# Patient Record
Sex: Male | Born: 1945
Health system: Southern US, Community
[De-identification: ages and names within clinical notes are randomized; demographics above are authoritative.]

## PROBLEM LIST (undated history)

## (undated) DIAGNOSIS — I4891 Unspecified atrial fibrillation: Secondary | ICD-10-CM

## (undated) DIAGNOSIS — E119 Type 2 diabetes mellitus without complications: Secondary | ICD-10-CM

## (undated) DIAGNOSIS — R972 Elevated prostate specific antigen [PSA]: Secondary | ICD-10-CM

## (undated) DIAGNOSIS — I499 Cardiac arrhythmia, unspecified: Secondary | ICD-10-CM

## (undated) DIAGNOSIS — Z860100 Personal history of colon polyps, unspecified: Secondary | ICD-10-CM

## (undated) DIAGNOSIS — G459 Transient cerebral ischemic attack, unspecified: Secondary | ICD-10-CM

## (undated) DIAGNOSIS — I219 Acute myocardial infarction, unspecified: Secondary | ICD-10-CM

## (undated) DIAGNOSIS — N289 Disorder of kidney and ureter, unspecified: Secondary | ICD-10-CM

## (undated) DIAGNOSIS — K5790 Diverticulosis of intestine, part unspecified, without perforation or abscess without bleeding: Secondary | ICD-10-CM

## (undated) DIAGNOSIS — J45909 Unspecified asthma, uncomplicated: Secondary | ICD-10-CM

## (undated) DIAGNOSIS — E782 Mixed hyperlipidemia: Secondary | ICD-10-CM

## (undated) DIAGNOSIS — I10 Essential (primary) hypertension: Secondary | ICD-10-CM

## (undated) DIAGNOSIS — J449 Chronic obstructive pulmonary disease, unspecified: Secondary | ICD-10-CM

## (undated) HISTORY — DX: Unspecified asthma, uncomplicated: J45.909

## (undated) HISTORY — DX: Acute myocardial infarction, unspecified: I21.9

## (undated) HISTORY — DX: Mixed hyperlipidemia: E78.2

## (undated) HISTORY — DX: Unspecified atrial fibrillation: I48.91

## (undated) HISTORY — DX: Cardiac arrhythmia, unspecified: I49.9

## (undated) HISTORY — DX: Transient cerebral ischemic attack, unspecified: G45.9

## (undated) HISTORY — PX: LUNG LOBECTOMY: SHX167

## (undated) HISTORY — DX: Disorder of kidney and ureter, unspecified: N28.9

## (undated) HISTORY — DX: Type 2 diabetes mellitus without complications: E11.9

## (undated) HISTORY — DX: Elevated prostate specific antigen (PSA): R97.20

## (undated) HISTORY — DX: Diverticulosis of intestine, part unspecified, without perforation or abscess without bleeding: K57.90

## (undated) HISTORY — PX: TONSILLECTOMY AND ADENOIDECTOMY: SHX28

## (undated) HISTORY — DX: Essential (primary) hypertension: I10

## (undated) HISTORY — PX: OTHER SURGICAL HISTORY: SHX169

## (undated) HISTORY — DX: Chronic obstructive pulmonary disease, unspecified: J44.9

## (undated) HISTORY — DX: Personal history of colon polyps, unspecified: Z86.0100

---

## 2000-10-11 ENCOUNTER — Inpatient Hospital Stay (HOSPITAL_COMMUNITY): Admission: AD | Admit: 2000-10-11 | Discharge: 2000-10-18 | Payer: Self-pay | Admitting: Thoracic Surgery

## 2000-10-11 ENCOUNTER — Encounter: Payer: Self-pay | Admitting: Thoracic Surgery

## 2000-10-12 ENCOUNTER — Encounter: Payer: Self-pay | Admitting: Thoracic Surgery

## 2000-10-13 ENCOUNTER — Encounter: Payer: Self-pay | Admitting: Thoracic Surgery

## 2000-10-14 ENCOUNTER — Encounter: Payer: Self-pay | Admitting: Thoracic Surgery

## 2000-10-15 ENCOUNTER — Encounter: Payer: Self-pay | Admitting: Thoracic Surgery

## 2000-10-15 ENCOUNTER — Encounter: Payer: Self-pay | Admitting: Surgery

## 2000-10-16 ENCOUNTER — Encounter: Payer: Self-pay | Admitting: Thoracic Surgery

## 2000-10-17 ENCOUNTER — Encounter: Payer: Self-pay | Admitting: Thoracic Surgery

## 2000-10-18 ENCOUNTER — Encounter: Payer: Self-pay | Admitting: Thoracic Surgery

## 2000-10-25 ENCOUNTER — Encounter: Admission: RE | Admit: 2000-10-25 | Discharge: 2000-10-25 | Payer: Self-pay | Admitting: Thoracic Surgery

## 2000-10-25 ENCOUNTER — Encounter: Payer: Self-pay | Admitting: Thoracic Surgery

## 2000-11-15 ENCOUNTER — Encounter: Admission: RE | Admit: 2000-11-15 | Discharge: 2000-11-15 | Payer: Self-pay | Admitting: Thoracic Surgery

## 2000-11-15 ENCOUNTER — Encounter: Payer: Self-pay | Admitting: Thoracic Surgery

## 2014-10-29 DIAGNOSIS — I480 Paroxysmal atrial fibrillation: Secondary | ICD-10-CM | POA: Insufficient documentation

## 2014-10-29 DIAGNOSIS — I10 Essential (primary) hypertension: Secondary | ICD-10-CM | POA: Insufficient documentation

## 2015-10-01 DIAGNOSIS — E785 Hyperlipidemia, unspecified: Secondary | ICD-10-CM | POA: Insufficient documentation

## 2015-12-15 DIAGNOSIS — I10 Essential (primary) hypertension: Secondary | ICD-10-CM | POA: Diagnosis not present

## 2015-12-15 DIAGNOSIS — Z6829 Body mass index (BMI) 29.0-29.9, adult: Secondary | ICD-10-CM | POA: Diagnosis not present

## 2015-12-15 DIAGNOSIS — N529 Male erectile dysfunction, unspecified: Secondary | ICD-10-CM | POA: Diagnosis not present

## 2015-12-15 DIAGNOSIS — J449 Chronic obstructive pulmonary disease, unspecified: Secondary | ICD-10-CM | POA: Diagnosis not present

## 2015-12-15 DIAGNOSIS — E663 Overweight: Secondary | ICD-10-CM | POA: Diagnosis not present

## 2016-01-18 DIAGNOSIS — N529 Male erectile dysfunction, unspecified: Secondary | ICD-10-CM | POA: Diagnosis not present

## 2016-01-18 DIAGNOSIS — R972 Elevated prostate specific antigen [PSA]: Secondary | ICD-10-CM | POA: Diagnosis not present

## 2016-01-18 DIAGNOSIS — N401 Enlarged prostate with lower urinary tract symptoms: Secondary | ICD-10-CM | POA: Diagnosis not present

## 2016-03-02 DIAGNOSIS — N529 Male erectile dysfunction, unspecified: Secondary | ICD-10-CM | POA: Diagnosis not present

## 2016-03-02 DIAGNOSIS — Z961 Presence of intraocular lens: Secondary | ICD-10-CM | POA: Diagnosis not present

## 2016-03-02 DIAGNOSIS — N401 Enlarged prostate with lower urinary tract symptoms: Secondary | ICD-10-CM | POA: Diagnosis not present

## 2016-03-02 DIAGNOSIS — R972 Elevated prostate specific antigen [PSA]: Secondary | ICD-10-CM | POA: Diagnosis not present

## 2016-03-02 DIAGNOSIS — H40013 Open angle with borderline findings, low risk, bilateral: Secondary | ICD-10-CM | POA: Diagnosis not present

## 2016-03-15 DIAGNOSIS — R972 Elevated prostate specific antigen [PSA]: Secondary | ICD-10-CM | POA: Diagnosis not present

## 2016-03-15 DIAGNOSIS — R739 Hyperglycemia, unspecified: Secondary | ICD-10-CM | POA: Diagnosis not present

## 2016-03-24 DIAGNOSIS — I480 Paroxysmal atrial fibrillation: Secondary | ICD-10-CM | POA: Diagnosis not present

## 2016-03-24 DIAGNOSIS — I10 Essential (primary) hypertension: Secondary | ICD-10-CM | POA: Diagnosis not present

## 2016-03-24 DIAGNOSIS — E785 Hyperlipidemia, unspecified: Secondary | ICD-10-CM | POA: Diagnosis not present

## 2016-03-24 DIAGNOSIS — Z6830 Body mass index (BMI) 30.0-30.9, adult: Secondary | ICD-10-CM | POA: Diagnosis not present

## 2016-03-28 DIAGNOSIS — E1165 Type 2 diabetes mellitus with hyperglycemia: Secondary | ICD-10-CM | POA: Diagnosis not present

## 2016-06-03 DIAGNOSIS — R972 Elevated prostate specific antigen [PSA]: Secondary | ICD-10-CM | POA: Diagnosis not present

## 2016-06-03 DIAGNOSIS — N401 Enlarged prostate with lower urinary tract symptoms: Secondary | ICD-10-CM | POA: Diagnosis not present

## 2016-06-03 DIAGNOSIS — N529 Male erectile dysfunction, unspecified: Secondary | ICD-10-CM | POA: Diagnosis not present

## 2016-06-06 DIAGNOSIS — J101 Influenza due to other identified influenza virus with other respiratory manifestations: Secondary | ICD-10-CM | POA: Diagnosis not present

## 2016-06-06 DIAGNOSIS — Z6829 Body mass index (BMI) 29.0-29.9, adult: Secondary | ICD-10-CM | POA: Diagnosis not present

## 2016-06-06 DIAGNOSIS — E663 Overweight: Secondary | ICD-10-CM | POA: Diagnosis not present

## 2016-07-01 DIAGNOSIS — E663 Overweight: Secondary | ICD-10-CM | POA: Diagnosis not present

## 2016-07-01 DIAGNOSIS — E782 Mixed hyperlipidemia: Secondary | ICD-10-CM | POA: Diagnosis not present

## 2016-07-01 DIAGNOSIS — E785 Hyperlipidemia, unspecified: Secondary | ICD-10-CM | POA: Diagnosis not present

## 2016-07-01 DIAGNOSIS — Z6829 Body mass index (BMI) 29.0-29.9, adult: Secondary | ICD-10-CM | POA: Diagnosis not present

## 2016-07-01 DIAGNOSIS — E119 Type 2 diabetes mellitus without complications: Secondary | ICD-10-CM | POA: Diagnosis not present

## 2016-07-01 DIAGNOSIS — I10 Essential (primary) hypertension: Secondary | ICD-10-CM | POA: Diagnosis not present

## 2016-07-14 DIAGNOSIS — H4302 Vitreous prolapse, left eye: Secondary | ICD-10-CM | POA: Diagnosis not present

## 2016-07-29 DIAGNOSIS — H43812 Vitreous degeneration, left eye: Secondary | ICD-10-CM | POA: Diagnosis not present

## 2016-07-29 DIAGNOSIS — H35033 Hypertensive retinopathy, bilateral: Secondary | ICD-10-CM | POA: Diagnosis not present

## 2016-07-29 DIAGNOSIS — H4312 Vitreous hemorrhage, left eye: Secondary | ICD-10-CM | POA: Diagnosis not present

## 2016-07-29 DIAGNOSIS — H35422 Microcystoid degeneration of retina, left eye: Secondary | ICD-10-CM | POA: Diagnosis not present

## 2016-08-29 DIAGNOSIS — H35422 Microcystoid degeneration of retina, left eye: Secondary | ICD-10-CM | POA: Diagnosis not present

## 2016-08-29 DIAGNOSIS — H35033 Hypertensive retinopathy, bilateral: Secondary | ICD-10-CM | POA: Diagnosis not present

## 2016-08-29 DIAGNOSIS — E113291 Type 2 diabetes mellitus with mild nonproliferative diabetic retinopathy without macular edema, right eye: Secondary | ICD-10-CM | POA: Diagnosis not present

## 2016-08-29 DIAGNOSIS — H4312 Vitreous hemorrhage, left eye: Secondary | ICD-10-CM | POA: Diagnosis not present

## 2016-08-31 DIAGNOSIS — N529 Male erectile dysfunction, unspecified: Secondary | ICD-10-CM | POA: Diagnosis not present

## 2016-08-31 DIAGNOSIS — N401 Enlarged prostate with lower urinary tract symptoms: Secondary | ICD-10-CM | POA: Diagnosis not present

## 2016-08-31 DIAGNOSIS — R339 Retention of urine, unspecified: Secondary | ICD-10-CM | POA: Diagnosis not present

## 2016-08-31 DIAGNOSIS — H40013 Open angle with borderline findings, low risk, bilateral: Secondary | ICD-10-CM | POA: Diagnosis not present

## 2016-08-31 DIAGNOSIS — H4312 Vitreous hemorrhage, left eye: Secondary | ICD-10-CM | POA: Diagnosis not present

## 2016-08-31 DIAGNOSIS — R972 Elevated prostate specific antigen [PSA]: Secondary | ICD-10-CM | POA: Diagnosis not present

## 2016-08-31 DIAGNOSIS — Z961 Presence of intraocular lens: Secondary | ICD-10-CM | POA: Diagnosis not present

## 2016-09-07 ENCOUNTER — Other Ambulatory Visit: Payer: Self-pay | Admitting: Pharmacy Technician

## 2016-09-07 NOTE — Patient Outreach (Signed)
[facility]) Care Management  09/07/2016  Winfred [place] 1946/03/07 [id number]  Contacted patient in reference to medication adherence for [employer]. Patient insist that he takes Atorvastatin as prescribed and does not miss any doses or have any barriers. I made sure the patient was aware that he could get 3 month supplies going forward if he was interested.  Daryll Brod, CPhT [facility] [employer] [phone]

## 2016-09-09 DIAGNOSIS — I10 Essential (primary) hypertension: Secondary | ICD-10-CM | POA: Diagnosis not present

## 2016-09-09 DIAGNOSIS — E785 Hyperlipidemia, unspecified: Secondary | ICD-10-CM | POA: Diagnosis not present

## 2016-09-09 DIAGNOSIS — I480 Paroxysmal atrial fibrillation: Secondary | ICD-10-CM | POA: Diagnosis not present

## 2016-09-09 DIAGNOSIS — Z6830 Body mass index (BMI) 30.0-30.9, adult: Secondary | ICD-10-CM | POA: Diagnosis not present

## 2016-09-24 DIAGNOSIS — Z8673 Personal history of transient ischemic attack (TIA), and cerebral infarction without residual deficits: Secondary | ICD-10-CM | POA: Diagnosis not present

## 2016-09-24 DIAGNOSIS — R42 Dizziness and giddiness: Secondary | ICD-10-CM | POA: Diagnosis not present

## 2016-09-24 DIAGNOSIS — G459 Transient cerebral ischemic attack, unspecified: Secondary | ICD-10-CM | POA: Diagnosis not present

## 2016-09-24 DIAGNOSIS — Z5321 Procedure and treatment not carried out due to patient leaving prior to being seen by health care provider: Secondary | ICD-10-CM | POA: Diagnosis not present

## 2016-09-24 DIAGNOSIS — I4891 Unspecified atrial fibrillation: Secondary | ICD-10-CM | POA: Diagnosis not present

## 2016-09-24 DIAGNOSIS — R000 Tachycardia, unspecified: Secondary | ICD-10-CM | POA: Diagnosis not present

## 2016-09-24 DIAGNOSIS — E119 Type 2 diabetes mellitus without complications: Secondary | ICD-10-CM | POA: Diagnosis not present

## 2016-09-24 DIAGNOSIS — R55 Syncope and collapse: Secondary | ICD-10-CM | POA: Diagnosis not present

## 2016-09-28 DIAGNOSIS — E663 Overweight: Secondary | ICD-10-CM | POA: Diagnosis not present

## 2016-09-28 DIAGNOSIS — E785 Hyperlipidemia, unspecified: Secondary | ICD-10-CM | POA: Diagnosis not present

## 2016-09-28 DIAGNOSIS — E119 Type 2 diabetes mellitus without complications: Secondary | ICD-10-CM | POA: Diagnosis not present

## 2016-09-28 DIAGNOSIS — Z6829 Body mass index (BMI) 29.0-29.9, adult: Secondary | ICD-10-CM | POA: Diagnosis not present

## 2016-09-28 DIAGNOSIS — Z9181 History of falling: Secondary | ICD-10-CM | POA: Diagnosis not present

## 2016-10-01 DIAGNOSIS — Z87891 Personal history of nicotine dependence: Secondary | ICD-10-CM | POA: Diagnosis not present

## 2016-10-01 DIAGNOSIS — Z8673 Personal history of transient ischemic attack (TIA), and cerebral infarction without residual deficits: Secondary | ICD-10-CM | POA: Diagnosis not present

## 2016-10-01 DIAGNOSIS — I4892 Unspecified atrial flutter: Secondary | ICD-10-CM | POA: Diagnosis not present

## 2016-10-01 DIAGNOSIS — I10 Essential (primary) hypertension: Secondary | ICD-10-CM | POA: Diagnosis not present

## 2016-10-01 DIAGNOSIS — Z79899 Other long term (current) drug therapy: Secondary | ICD-10-CM | POA: Diagnosis not present

## 2016-10-01 DIAGNOSIS — I4891 Unspecified atrial fibrillation: Secondary | ICD-10-CM | POA: Diagnosis not present

## 2016-10-01 DIAGNOSIS — E7800 Pure hypercholesterolemia, unspecified: Secondary | ICD-10-CM | POA: Diagnosis not present

## 2016-10-01 DIAGNOSIS — R55 Syncope and collapse: Secondary | ICD-10-CM | POA: Diagnosis not present

## 2016-10-01 DIAGNOSIS — E119 Type 2 diabetes mellitus without complications: Secondary | ICD-10-CM | POA: Diagnosis not present

## 2016-10-01 DIAGNOSIS — Z7984 Long term (current) use of oral hypoglycemic drugs: Secondary | ICD-10-CM | POA: Diagnosis not present

## 2016-10-12 DIAGNOSIS — R972 Elevated prostate specific antigen [PSA]: Secondary | ICD-10-CM | POA: Diagnosis not present

## 2016-10-12 DIAGNOSIS — N401 Enlarged prostate with lower urinary tract symptoms: Secondary | ICD-10-CM | POA: Diagnosis not present

## 2016-10-12 DIAGNOSIS — R944 Abnormal results of kidney function studies: Secondary | ICD-10-CM | POA: Diagnosis not present

## 2016-10-12 DIAGNOSIS — Z79899 Other long term (current) drug therapy: Secondary | ICD-10-CM | POA: Diagnosis not present

## 2016-10-12 DIAGNOSIS — R55 Syncope and collapse: Secondary | ICD-10-CM | POA: Diagnosis not present

## 2016-10-12 DIAGNOSIS — D729 Disorder of white blood cells, unspecified: Secondary | ICD-10-CM | POA: Diagnosis not present

## 2016-10-31 DIAGNOSIS — R739 Hyperglycemia, unspecified: Secondary | ICD-10-CM | POA: Diagnosis not present

## 2016-10-31 DIAGNOSIS — D649 Anemia, unspecified: Secondary | ICD-10-CM | POA: Diagnosis not present

## 2016-11-02 DIAGNOSIS — D519 Vitamin B12 deficiency anemia, unspecified: Secondary | ICD-10-CM | POA: Diagnosis not present

## 2016-11-02 DIAGNOSIS — R5383 Other fatigue: Secondary | ICD-10-CM | POA: Diagnosis not present

## 2016-11-02 DIAGNOSIS — D649 Anemia, unspecified: Secondary | ICD-10-CM | POA: Diagnosis not present

## 2016-12-21 DIAGNOSIS — R0609 Other forms of dyspnea: Secondary | ICD-10-CM | POA: Diagnosis not present

## 2016-12-21 DIAGNOSIS — Z7901 Long term (current) use of anticoagulants: Secondary | ICD-10-CM | POA: Insufficient documentation

## 2016-12-21 DIAGNOSIS — I480 Paroxysmal atrial fibrillation: Secondary | ICD-10-CM | POA: Diagnosis not present

## 2016-12-21 DIAGNOSIS — I10 Essential (primary) hypertension: Secondary | ICD-10-CM | POA: Diagnosis not present

## 2016-12-29 DIAGNOSIS — Z79899 Other long term (current) drug therapy: Secondary | ICD-10-CM | POA: Diagnosis not present

## 2016-12-29 DIAGNOSIS — I498 Other specified cardiac arrhythmias: Secondary | ICD-10-CM | POA: Diagnosis not present

## 2016-12-29 DIAGNOSIS — I10 Essential (primary) hypertension: Secondary | ICD-10-CM | POA: Diagnosis not present

## 2016-12-29 DIAGNOSIS — E119 Type 2 diabetes mellitus without complications: Secondary | ICD-10-CM | POA: Diagnosis not present

## 2016-12-29 DIAGNOSIS — G459 Transient cerebral ischemic attack, unspecified: Secondary | ICD-10-CM | POA: Diagnosis not present

## 2016-12-29 DIAGNOSIS — Z720 Tobacco use: Secondary | ICD-10-CM | POA: Diagnosis not present

## 2016-12-29 DIAGNOSIS — Z6830 Body mass index (BMI) 30.0-30.9, adult: Secondary | ICD-10-CM | POA: Diagnosis not present

## 2016-12-29 DIAGNOSIS — Z9181 History of falling: Secondary | ICD-10-CM | POA: Diagnosis not present

## 2016-12-29 DIAGNOSIS — E785 Hyperlipidemia, unspecified: Secondary | ICD-10-CM | POA: Diagnosis not present

## 2017-01-02 ENCOUNTER — Telehealth: Payer: Self-pay

## 2017-01-02 NOTE — Telephone Encounter (Signed)
L/m to call ofc to schedule appt with [email or web address]

## 2017-01-10 ENCOUNTER — Ambulatory Visit (INDEPENDENT_AMBULATORY_CARE_PROVIDER_SITE_OTHER): Payer: PPO | Admitting: Cardiology

## 2017-01-10 ENCOUNTER — Encounter: Payer: Self-pay | Admitting: Cardiology

## 2017-01-10 VITALS — BP 102/60 | HR 84 | Ht 70.0 in | Wt 209.1 lb

## 2017-01-10 DIAGNOSIS — Z7901 Long term (current) use of anticoagulants: Secondary | ICD-10-CM | POA: Diagnosis not present

## 2017-01-10 DIAGNOSIS — I480 Paroxysmal atrial fibrillation: Secondary | ICD-10-CM | POA: Diagnosis not present

## 2017-01-10 DIAGNOSIS — E785 Hyperlipidemia, unspecified: Secondary | ICD-10-CM

## 2017-01-10 DIAGNOSIS — I10 Essential (primary) hypertension: Secondary | ICD-10-CM | POA: Diagnosis not present

## 2017-01-10 NOTE — Patient Instructions (Signed)
Medication Instructions:  None  Labwork: None  Testing/Procedures: None  Follow-Up: 3 months  Any Other Special Instructions Will Be Listed Below (If Applicable).     If you need a refill on your cardiac medications before your next appointment, please call your pharmacy.

## 2017-01-10 NOTE — Progress Notes (Signed)
Cardiology Office Note:    Date:  01/10/2017   ID:  Samuel Green, DOB [birth year], MRN [id number]  PCP:  Lucianne Lei, MD  Cardiologist:  Garwin Brothers, MD   Referring MD: No ref. provider found    ASSESSMENT:    1. PAF (paroxysmal atrial fibrillation) (HCC)   2. Essential hypertension   3. Current use of long term anticoagulation   4. Dyslipidemia    PLAN:    In order of problems listed above:  1. I reassured the patient about my findings today. Clinically is in sinus rhythm. Advised him to continue his current activities. He is not taking Cardizem and I do not see the necessity for at this time because of his pulse and blood pressure. He also is in sinus rhythm at this time. 2. I discussed with the patient atrial fibrillation, disease process. Management and therapy including rate and rhythm control, anticoagulation benefits and potential risks were discussed extensively with the patient. Patient had multiple questions which were answered to patient's satisfaction.Diet was discussed with dyslipidemia and his lipids are followed by his primary care physician. 3. His blood pressure stable. He'll be seen in follow-up appointment in 3 months or earlier if he has any concerns.   Medication Adjustments/Labs and Tests Ordered: Current medicines are reviewed at length with the patient today.  Concerns regarding medicines are outlined above.  No orders of the defined types were placed in this encounter.  No orders of the defined types were placed in this encounter.    History of Present Illness:    Samuel Green is a 71 y.o. male who is being seen today for the evaluation of paroxysmal atrial fibrillation at the request of Dr Mathis Bud. Patient is a pleasant 71 year old male. He has past medical history of paroxysmal fibrillation and he is doing well on dronedarone therapy. He recently had 2 episodes of atrial fibrillation which were pretty significant. 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 Subsequently he saw his cardiologist who initiated him on Cardizem. The patient mentions to me that he does not take Cardizem at this time. Since he slow down on his umpiring is feeling much better. No chest pain orthopnea or PND. No palpitations. At the time of my evaluation is alert awake oriented and in no distress. He denies any chest pain  Past Medical History:  Diagnosis Date  . A-fib (HCC)   . Arrhythmia   . Hypertension     History reviewed. No pertinent surgical history.  Current Medications: Current Meds  Medication Sig  . Albuterol Sulfate 108 (90 Base) MCG/ACT AEPB Inhale 1-2 puffs into the lungs daily as needed.  Marland Kitchen apixaban (ELIQUIS) 5 MG TABS tablet Take 5 mg by mouth 2 (two) times daily.  Marland Kitchen atorvastatin (LIPITOR) 40 MG tablet Take 40 mg by mouth daily.  Marland Kitchen dronedarone (MULTAQ) 400 MG tablet Take 400 mg by mouth 2 (two) times daily.  . finasteride (PROSCAR) 5 MG tablet Take 5 mg by mouth daily.  Marland Kitchen ipratropium (ATROVENT) 0.02 % nebulizer solution Take 500 mcg by nebulization 4 (four) times daily as needed for Wheezing.  Marland Kitchen lisinopril-hydrochlorothiazide (PRINZIDE,ZESTORETIC) 20-25 MG tablet Take 1 tablet by mouth daily.  . metFORMIN (GLUCOPHAGE) 1000 MG tablet Take 1,000 mg by mouth 2 (two) times daily.  Marland Kitchen PARoxetine (PAXIL) 20 MG tablet Take 20 mg by mouth daily.  . tamsulosin (FLOMAX) 0.4 MG CAPS capsule Take 0.4 mg by mouth daily.  Allergies:   Morphine   Social History   Social History  . Marital status: Married    Spouse name: N/A  . Number of children: N/A  . Years of education: N/A   Social History Main Topics  . Smoking status: Former Smoker    Quit date: 01/11/1987  . Smokeless tobacco: Current User    Types: Chew  . Alcohol use No  . Drug use: No  . Sexual activity: Not Asked   Other Topics Concern  . None   Social History Narrative  . None     Family History: The patient's Family history is  unknown by patient.  ROS:   Please see the history of present illness.    All other systems reviewed and are negative.  EKGs/Labs/Other Studies Reviewed:    The following studies were reviewed today: I reviewed records from previous office notes and discussed this with the patient at extensive length. His past 2 cardiology office visits were also discussed.   Recent Labs: No results found for requested labs within last 8760 hours.  Recent Lipid Panel No results found for: CHOL, TRIG, HDL, CHOLHDL, VLDL, LDLCALC, LDLDIRECT  Physical Exam:    VS:  BP 102/60   Pulse 84   Ht 5\' 10"  (1.778 m)   Wt 209 lb 1.9 oz (94.9 kg)   SpO2 98%   BMI 30.01 kg/m     Wt Readings from Last 3 Encounters:  01/10/17 209 lb 1.9 oz (94.9 kg)     GEN: Patient is in no acute distress HEENT: Normal NECK: No JVD; No carotid bruits LYMPHATICS: No lymphadenopathy CARDIAC: S1 S2 regular, 2/6 systolic murmur at the apex. RESPIRATORY:  Clear to auscultation without rales, wheezing or rhonchi  ABDOMEN: Soft, non-tender, non-distended MUSCULOSKELETAL:  No edema; No deformity  SKIN: Warm and dry NEUROLOGIC:  Alert and oriented x 3 PSYCHIATRIC:  Normal affect    Signed, Garwin Brothers, MD  01/10/2017 11:10 AM    [facility]

## 2017-01-12 DIAGNOSIS — N401 Enlarged prostate with lower urinary tract symptoms: Secondary | ICD-10-CM | POA: Diagnosis not present

## 2017-01-12 DIAGNOSIS — R972 Elevated prostate specific antigen [PSA]: Secondary | ICD-10-CM | POA: Diagnosis not present

## 2017-01-27 DIAGNOSIS — N401 Enlarged prostate with lower urinary tract symptoms: Secondary | ICD-10-CM | POA: Diagnosis not present

## 2017-01-27 DIAGNOSIS — R972 Elevated prostate specific antigen [PSA]: Secondary | ICD-10-CM | POA: Diagnosis not present

## 2017-03-22 DIAGNOSIS — Z7901 Long term (current) use of anticoagulants: Secondary | ICD-10-CM | POA: Diagnosis not present

## 2017-03-22 DIAGNOSIS — I10 Essential (primary) hypertension: Secondary | ICD-10-CM | POA: Diagnosis not present

## 2017-03-22 DIAGNOSIS — I480 Paroxysmal atrial fibrillation: Secondary | ICD-10-CM | POA: Diagnosis not present

## 2017-03-22 DIAGNOSIS — R0609 Other forms of dyspnea: Secondary | ICD-10-CM | POA: Diagnosis not present

## 2017-03-30 DIAGNOSIS — D519 Vitamin B12 deficiency anemia, unspecified: Secondary | ICD-10-CM | POA: Diagnosis not present

## 2017-03-30 DIAGNOSIS — E663 Overweight: Secondary | ICD-10-CM | POA: Diagnosis not present

## 2017-03-30 DIAGNOSIS — N400 Enlarged prostate without lower urinary tract symptoms: Secondary | ICD-10-CM | POA: Diagnosis not present

## 2017-03-30 DIAGNOSIS — I10 Essential (primary) hypertension: Secondary | ICD-10-CM | POA: Diagnosis not present

## 2017-03-30 DIAGNOSIS — J440 Chronic obstructive pulmonary disease with acute lower respiratory infection: Secondary | ICD-10-CM | POA: Diagnosis not present

## 2017-03-30 DIAGNOSIS — E785 Hyperlipidemia, unspecified: Secondary | ICD-10-CM | POA: Diagnosis not present

## 2017-03-30 DIAGNOSIS — J449 Chronic obstructive pulmonary disease, unspecified: Secondary | ICD-10-CM | POA: Diagnosis not present

## 2017-03-30 DIAGNOSIS — Z6830 Body mass index (BMI) 30.0-30.9, adult: Secondary | ICD-10-CM | POA: Diagnosis not present

## 2017-03-30 DIAGNOSIS — Z79899 Other long term (current) drug therapy: Secondary | ICD-10-CM | POA: Diagnosis not present

## 2017-03-30 DIAGNOSIS — J309 Allergic rhinitis, unspecified: Secondary | ICD-10-CM | POA: Diagnosis not present

## 2017-03-30 DIAGNOSIS — E119 Type 2 diabetes mellitus without complications: Secondary | ICD-10-CM | POA: Diagnosis not present

## 2017-03-30 DIAGNOSIS — Z1331 Encounter for screening for depression: Secondary | ICD-10-CM | POA: Diagnosis not present

## 2017-04-03 ENCOUNTER — Other Ambulatory Visit: Payer: Self-pay | Admitting: Pharmacy Technician

## 2017-04-03 NOTE — Patient Outreach (Signed)
[facility]) Care Management  04/03/2017  Jemaine Zisk [birth year] [id number]  Incoming [employer] EMMI call in reference to medication adherence. HIPAA identifiers verified and verbal consent received. Patient states he takes all of his medications daily as prescribed and does not miss any doses. He currently does not have any barriers that would affect his adherence.  Daryll Brod, CPhT [employer] [employer] [phone]

## 2017-04-06 ENCOUNTER — Ambulatory Visit: Payer: PPO | Admitting: Cardiology

## 2017-04-07 ENCOUNTER — Encounter: Payer: Self-pay | Admitting: Cardiology

## 2017-04-13 ENCOUNTER — Ambulatory Visit: Payer: PPO | Admitting: Cardiology

## 2017-04-26 DIAGNOSIS — Z1211 Encounter for screening for malignant neoplasm of colon: Secondary | ICD-10-CM | POA: Diagnosis not present

## 2017-04-26 DIAGNOSIS — Z86010 Personal history of colonic polyps: Secondary | ICD-10-CM | POA: Diagnosis not present

## 2017-06-28 DIAGNOSIS — E669 Obesity, unspecified: Secondary | ICD-10-CM | POA: Diagnosis not present

## 2017-06-28 DIAGNOSIS — I10 Essential (primary) hypertension: Secondary | ICD-10-CM | POA: Diagnosis not present

## 2017-06-28 DIAGNOSIS — Z6830 Body mass index (BMI) 30.0-30.9, adult: Secondary | ICD-10-CM | POA: Diagnosis not present

## 2017-06-28 DIAGNOSIS — E785 Hyperlipidemia, unspecified: Secondary | ICD-10-CM | POA: Diagnosis not present

## 2017-06-28 DIAGNOSIS — D519 Vitamin B12 deficiency anemia, unspecified: Secondary | ICD-10-CM | POA: Diagnosis not present

## 2017-06-28 DIAGNOSIS — E119 Type 2 diabetes mellitus without complications: Secondary | ICD-10-CM | POA: Diagnosis not present

## 2017-06-28 DIAGNOSIS — R69 Illness, unspecified: Secondary | ICD-10-CM | POA: Diagnosis not present

## 2017-06-28 DIAGNOSIS — J449 Chronic obstructive pulmonary disease, unspecified: Secondary | ICD-10-CM | POA: Diagnosis not present

## 2017-06-28 DIAGNOSIS — I498 Other specified cardiac arrhythmias: Secondary | ICD-10-CM | POA: Diagnosis not present

## 2017-07-12 DIAGNOSIS — R972 Elevated prostate specific antigen [PSA]: Secondary | ICD-10-CM | POA: Diagnosis not present

## 2017-07-12 DIAGNOSIS — N401 Enlarged prostate with lower urinary tract symptoms: Secondary | ICD-10-CM | POA: Diagnosis not present

## 2017-10-04 DIAGNOSIS — I480 Paroxysmal atrial fibrillation: Secondary | ICD-10-CM | POA: Diagnosis not present

## 2017-10-04 DIAGNOSIS — I10 Essential (primary) hypertension: Secondary | ICD-10-CM | POA: Diagnosis not present

## 2017-10-04 DIAGNOSIS — Z7901 Long term (current) use of anticoagulants: Secondary | ICD-10-CM | POA: Diagnosis not present

## 2017-10-04 DIAGNOSIS — R0609 Other forms of dyspnea: Secondary | ICD-10-CM | POA: Diagnosis not present

## 2017-10-30 DIAGNOSIS — E0821 Diabetes mellitus due to underlying condition with diabetic nephropathy: Secondary | ICD-10-CM | POA: Diagnosis not present

## 2017-10-30 DIAGNOSIS — Z79899 Other long term (current) drug therapy: Secondary | ICD-10-CM | POA: Diagnosis not present

## 2017-10-30 DIAGNOSIS — Z720 Tobacco use: Secondary | ICD-10-CM | POA: Diagnosis not present

## 2017-10-30 DIAGNOSIS — I6350 Cerebral infarction due to unspecified occlusion or stenosis of unspecified cerebral artery: Secondary | ICD-10-CM | POA: Diagnosis not present

## 2017-10-30 DIAGNOSIS — E119 Type 2 diabetes mellitus without complications: Secondary | ICD-10-CM | POA: Diagnosis not present

## 2017-10-30 DIAGNOSIS — Z6830 Body mass index (BMI) 30.0-30.9, adult: Secondary | ICD-10-CM | POA: Diagnosis not present

## 2017-10-30 DIAGNOSIS — N400 Enlarged prostate without lower urinary tract symptoms: Secondary | ICD-10-CM | POA: Diagnosis not present

## 2017-10-30 DIAGNOSIS — Z23 Encounter for immunization: Secondary | ICD-10-CM | POA: Diagnosis not present

## 2017-10-30 DIAGNOSIS — Z1339 Encounter for screening examination for other mental health and behavioral disorders: Secondary | ICD-10-CM | POA: Diagnosis not present

## 2017-10-30 DIAGNOSIS — I10 Essential (primary) hypertension: Secondary | ICD-10-CM | POA: Diagnosis not present

## 2017-10-30 DIAGNOSIS — R69 Illness, unspecified: Secondary | ICD-10-CM | POA: Diagnosis not present

## 2017-10-30 DIAGNOSIS — E785 Hyperlipidemia, unspecified: Secondary | ICD-10-CM | POA: Diagnosis not present

## 2018-01-15 DIAGNOSIS — R972 Elevated prostate specific antigen [PSA]: Secondary | ICD-10-CM | POA: Diagnosis not present

## 2018-01-15 DIAGNOSIS — N401 Enlarged prostate with lower urinary tract symptoms: Secondary | ICD-10-CM | POA: Diagnosis not present

## 2018-02-10 DIAGNOSIS — Z7901 Long term (current) use of anticoagulants: Secondary | ICD-10-CM | POA: Diagnosis not present

## 2018-02-10 DIAGNOSIS — J9811 Atelectasis: Secondary | ICD-10-CM | POA: Diagnosis not present

## 2018-02-10 DIAGNOSIS — I4891 Unspecified atrial fibrillation: Secondary | ICD-10-CM | POA: Diagnosis not present

## 2018-02-10 DIAGNOSIS — R339 Retention of urine, unspecified: Secondary | ICD-10-CM | POA: Diagnosis not present

## 2018-02-10 DIAGNOSIS — I482 Chronic atrial fibrillation, unspecified: Secondary | ICD-10-CM | POA: Diagnosis not present

## 2018-02-10 DIAGNOSIS — R002 Palpitations: Secondary | ICD-10-CM | POA: Diagnosis not present

## 2018-02-12 DIAGNOSIS — N401 Enlarged prostate with lower urinary tract symptoms: Secondary | ICD-10-CM | POA: Diagnosis not present

## 2018-02-12 DIAGNOSIS — R338 Other retention of urine: Secondary | ICD-10-CM | POA: Diagnosis not present

## 2018-02-15 DIAGNOSIS — R338 Other retention of urine: Secondary | ICD-10-CM | POA: Diagnosis not present

## 2018-02-15 DIAGNOSIS — N401 Enlarged prostate with lower urinary tract symptoms: Secondary | ICD-10-CM | POA: Diagnosis not present

## 2018-03-01 DIAGNOSIS — E669 Obesity, unspecified: Secondary | ICD-10-CM | POA: Diagnosis not present

## 2018-03-01 DIAGNOSIS — E0821 Diabetes mellitus due to underlying condition with diabetic nephropathy: Secondary | ICD-10-CM | POA: Diagnosis not present

## 2018-03-01 DIAGNOSIS — R69 Illness, unspecified: Secondary | ICD-10-CM | POA: Diagnosis not present

## 2018-03-01 DIAGNOSIS — R002 Palpitations: Secondary | ICD-10-CM | POA: Diagnosis not present

## 2018-03-01 DIAGNOSIS — Z9181 History of falling: Secondary | ICD-10-CM | POA: Diagnosis not present

## 2018-03-01 DIAGNOSIS — Z6830 Body mass index (BMI) 30.0-30.9, adult: Secondary | ICD-10-CM | POA: Diagnosis not present

## 2018-03-01 DIAGNOSIS — J449 Chronic obstructive pulmonary disease, unspecified: Secondary | ICD-10-CM | POA: Diagnosis not present

## 2018-03-01 DIAGNOSIS — E119 Type 2 diabetes mellitus without complications: Secondary | ICD-10-CM | POA: Diagnosis not present

## 2018-03-01 DIAGNOSIS — D519 Vitamin B12 deficiency anemia, unspecified: Secondary | ICD-10-CM | POA: Diagnosis not present

## 2018-03-01 DIAGNOSIS — Z23 Encounter for immunization: Secondary | ICD-10-CM | POA: Diagnosis not present

## 2018-03-02 DIAGNOSIS — E119 Type 2 diabetes mellitus without complications: Secondary | ICD-10-CM | POA: Diagnosis not present

## 2018-03-02 DIAGNOSIS — E785 Hyperlipidemia, unspecified: Secondary | ICD-10-CM | POA: Diagnosis not present

## 2018-03-21 DIAGNOSIS — N401 Enlarged prostate with lower urinary tract symptoms: Secondary | ICD-10-CM | POA: Diagnosis not present

## 2018-03-21 DIAGNOSIS — N481 Balanitis: Secondary | ICD-10-CM | POA: Diagnosis not present

## 2018-03-21 DIAGNOSIS — N471 Phimosis: Secondary | ICD-10-CM | POA: Diagnosis not present

## 2018-03-21 DIAGNOSIS — R338 Other retention of urine: Secondary | ICD-10-CM | POA: Diagnosis not present

## 2018-04-03 DIAGNOSIS — J069 Acute upper respiratory infection, unspecified: Secondary | ICD-10-CM | POA: Diagnosis not present

## 2018-04-12 DIAGNOSIS — R0600 Dyspnea, unspecified: Secondary | ICD-10-CM | POA: Diagnosis not present

## 2018-04-12 DIAGNOSIS — J189 Pneumonia, unspecified organism: Secondary | ICD-10-CM | POA: Diagnosis not present

## 2018-05-21 DIAGNOSIS — Z7901 Long term (current) use of anticoagulants: Secondary | ICD-10-CM | POA: Diagnosis not present

## 2018-05-21 DIAGNOSIS — I480 Paroxysmal atrial fibrillation: Secondary | ICD-10-CM | POA: Diagnosis not present

## 2018-05-21 DIAGNOSIS — I10 Essential (primary) hypertension: Secondary | ICD-10-CM | POA: Diagnosis not present

## 2018-06-20 DIAGNOSIS — N401 Enlarged prostate with lower urinary tract symptoms: Secondary | ICD-10-CM | POA: Diagnosis not present

## 2018-06-20 DIAGNOSIS — N481 Balanitis: Secondary | ICD-10-CM | POA: Diagnosis not present

## 2018-06-20 DIAGNOSIS — N471 Phimosis: Secondary | ICD-10-CM | POA: Diagnosis not present

## 2018-06-20 DIAGNOSIS — R81 Glycosuria: Secondary | ICD-10-CM | POA: Diagnosis not present

## 2018-06-20 DIAGNOSIS — R338 Other retention of urine: Secondary | ICD-10-CM | POA: Diagnosis not present

## 2018-06-28 DIAGNOSIS — Z79899 Other long term (current) drug therapy: Secondary | ICD-10-CM | POA: Diagnosis not present

## 2018-06-28 DIAGNOSIS — M1990 Unspecified osteoarthritis, unspecified site: Secondary | ICD-10-CM | POA: Diagnosis not present

## 2018-06-28 DIAGNOSIS — E1140 Type 2 diabetes mellitus with diabetic neuropathy, unspecified: Secondary | ICD-10-CM | POA: Diagnosis not present

## 2018-06-28 DIAGNOSIS — R69 Illness, unspecified: Secondary | ICD-10-CM | POA: Diagnosis not present

## 2018-06-28 DIAGNOSIS — J449 Chronic obstructive pulmonary disease, unspecified: Secondary | ICD-10-CM | POA: Diagnosis not present

## 2018-06-28 DIAGNOSIS — E559 Vitamin D deficiency, unspecified: Secondary | ICD-10-CM | POA: Diagnosis not present

## 2018-06-28 DIAGNOSIS — D519 Vitamin B12 deficiency anemia, unspecified: Secondary | ICD-10-CM | POA: Diagnosis not present

## 2018-06-28 DIAGNOSIS — N400 Enlarged prostate without lower urinary tract symptoms: Secondary | ICD-10-CM | POA: Diagnosis not present

## 2018-06-28 DIAGNOSIS — E0821 Diabetes mellitus due to underlying condition with diabetic nephropathy: Secondary | ICD-10-CM | POA: Diagnosis not present

## 2018-06-28 DIAGNOSIS — Z6828 Body mass index (BMI) 28.0-28.9, adult: Secondary | ICD-10-CM | POA: Diagnosis not present

## 2018-06-28 DIAGNOSIS — R002 Palpitations: Secondary | ICD-10-CM | POA: Diagnosis not present

## 2018-06-28 DIAGNOSIS — E785 Hyperlipidemia, unspecified: Secondary | ICD-10-CM | POA: Diagnosis not present

## 2018-07-18 DIAGNOSIS — N401 Enlarged prostate with lower urinary tract symptoms: Secondary | ICD-10-CM | POA: Diagnosis not present

## 2018-07-18 DIAGNOSIS — N481 Balanitis: Secondary | ICD-10-CM | POA: Diagnosis not present

## 2018-07-18 DIAGNOSIS — R972 Elevated prostate specific antigen [PSA]: Secondary | ICD-10-CM | POA: Diagnosis not present

## 2018-07-30 DIAGNOSIS — I10 Essential (primary) hypertension: Secondary | ICD-10-CM | POA: Diagnosis not present

## 2018-07-30 DIAGNOSIS — J449 Chronic obstructive pulmonary disease, unspecified: Secondary | ICD-10-CM | POA: Diagnosis not present

## 2018-07-30 DIAGNOSIS — N400 Enlarged prostate without lower urinary tract symptoms: Secondary | ICD-10-CM | POA: Diagnosis not present

## 2018-07-30 DIAGNOSIS — E785 Hyperlipidemia, unspecified: Secondary | ICD-10-CM | POA: Diagnosis not present

## 2018-07-30 DIAGNOSIS — J309 Allergic rhinitis, unspecified: Secondary | ICD-10-CM | POA: Diagnosis not present

## 2018-07-30 DIAGNOSIS — E1140 Type 2 diabetes mellitus with diabetic neuropathy, unspecified: Secondary | ICD-10-CM | POA: Diagnosis not present

## 2018-07-30 DIAGNOSIS — E0821 Diabetes mellitus due to underlying condition with diabetic nephropathy: Secondary | ICD-10-CM | POA: Diagnosis not present

## 2018-09-22 DIAGNOSIS — E860 Dehydration: Secondary | ICD-10-CM | POA: Diagnosis not present

## 2018-09-22 DIAGNOSIS — J449 Chronic obstructive pulmonary disease, unspecified: Secondary | ICD-10-CM | POA: Diagnosis not present

## 2018-09-22 DIAGNOSIS — R531 Weakness: Secondary | ICD-10-CM | POA: Diagnosis not present

## 2018-09-22 DIAGNOSIS — Z8673 Personal history of transient ischemic attack (TIA), and cerebral infarction without residual deficits: Secondary | ICD-10-CM | POA: Diagnosis not present

## 2018-09-22 DIAGNOSIS — I480 Paroxysmal atrial fibrillation: Secondary | ICD-10-CM | POA: Diagnosis not present

## 2018-09-22 DIAGNOSIS — I10 Essential (primary) hypertension: Secondary | ICD-10-CM | POA: Diagnosis not present

## 2018-10-24 DIAGNOSIS — M9903 Segmental and somatic dysfunction of lumbar region: Secondary | ICD-10-CM | POA: Diagnosis not present

## 2018-10-24 DIAGNOSIS — M9902 Segmental and somatic dysfunction of thoracic region: Secondary | ICD-10-CM | POA: Diagnosis not present

## 2018-10-24 DIAGNOSIS — M9904 Segmental and somatic dysfunction of sacral region: Secondary | ICD-10-CM | POA: Diagnosis not present

## 2018-10-24 DIAGNOSIS — M9905 Segmental and somatic dysfunction of pelvic region: Secondary | ICD-10-CM | POA: Diagnosis not present

## 2018-10-26 DIAGNOSIS — H26493 Other secondary cataract, bilateral: Secondary | ICD-10-CM | POA: Diagnosis not present

## 2018-10-26 DIAGNOSIS — E119 Type 2 diabetes mellitus without complications: Secondary | ICD-10-CM | POA: Diagnosis not present

## 2018-10-26 DIAGNOSIS — H40023 Open angle with borderline findings, high risk, bilateral: Secondary | ICD-10-CM | POA: Diagnosis not present

## 2018-10-26 DIAGNOSIS — Z961 Presence of intraocular lens: Secondary | ICD-10-CM | POA: Diagnosis not present

## 2018-10-29 DIAGNOSIS — Z79899 Other long term (current) drug therapy: Secondary | ICD-10-CM | POA: Diagnosis not present

## 2018-10-29 DIAGNOSIS — Z6830 Body mass index (BMI) 30.0-30.9, adult: Secondary | ICD-10-CM | POA: Diagnosis not present

## 2018-10-29 DIAGNOSIS — E785 Hyperlipidemia, unspecified: Secondary | ICD-10-CM | POA: Diagnosis not present

## 2018-10-29 DIAGNOSIS — E669 Obesity, unspecified: Secondary | ICD-10-CM | POA: Diagnosis not present

## 2018-10-29 DIAGNOSIS — I10 Essential (primary) hypertension: Secondary | ICD-10-CM | POA: Diagnosis not present

## 2018-10-29 DIAGNOSIS — I498 Other specified cardiac arrhythmias: Secondary | ICD-10-CM | POA: Diagnosis not present

## 2018-10-29 DIAGNOSIS — E0821 Diabetes mellitus due to underlying condition with diabetic nephropathy: Secondary | ICD-10-CM | POA: Diagnosis not present

## 2018-10-29 DIAGNOSIS — E559 Vitamin D deficiency, unspecified: Secondary | ICD-10-CM | POA: Diagnosis not present

## 2018-10-30 DIAGNOSIS — R81 Glycosuria: Secondary | ICD-10-CM | POA: Diagnosis not present

## 2018-10-30 DIAGNOSIS — E0821 Diabetes mellitus due to underlying condition with diabetic nephropathy: Secondary | ICD-10-CM | POA: Diagnosis not present

## 2018-10-30 DIAGNOSIS — N401 Enlarged prostate with lower urinary tract symptoms: Secondary | ICD-10-CM | POA: Diagnosis not present

## 2018-10-30 DIAGNOSIS — Z79899 Other long term (current) drug therapy: Secondary | ICD-10-CM | POA: Diagnosis not present

## 2018-10-30 DIAGNOSIS — E785 Hyperlipidemia, unspecified: Secondary | ICD-10-CM | POA: Diagnosis not present

## 2018-10-30 DIAGNOSIS — R338 Other retention of urine: Secondary | ICD-10-CM | POA: Diagnosis not present

## 2018-10-30 DIAGNOSIS — R972 Elevated prostate specific antigen [PSA]: Secondary | ICD-10-CM | POA: Diagnosis not present

## 2018-10-30 DIAGNOSIS — E559 Vitamin D deficiency, unspecified: Secondary | ICD-10-CM | POA: Diagnosis not present

## 2018-11-19 DIAGNOSIS — I10 Essential (primary) hypertension: Secondary | ICD-10-CM | POA: Diagnosis not present

## 2018-11-19 DIAGNOSIS — Z7901 Long term (current) use of anticoagulants: Secondary | ICD-10-CM | POA: Diagnosis not present

## 2018-11-19 DIAGNOSIS — I480 Paroxysmal atrial fibrillation: Secondary | ICD-10-CM | POA: Diagnosis not present

## 2018-12-19 DIAGNOSIS — M9904 Segmental and somatic dysfunction of sacral region: Secondary | ICD-10-CM | POA: Diagnosis not present

## 2018-12-19 DIAGNOSIS — M9905 Segmental and somatic dysfunction of pelvic region: Secondary | ICD-10-CM | POA: Diagnosis not present

## 2018-12-19 DIAGNOSIS — M9902 Segmental and somatic dysfunction of thoracic region: Secondary | ICD-10-CM | POA: Diagnosis not present

## 2018-12-19 DIAGNOSIS — M9903 Segmental and somatic dysfunction of lumbar region: Secondary | ICD-10-CM | POA: Diagnosis not present

## 2018-12-27 DIAGNOSIS — H26492 Other secondary cataract, left eye: Secondary | ICD-10-CM | POA: Diagnosis not present

## 2018-12-27 DIAGNOSIS — H18413 Arcus senilis, bilateral: Secondary | ICD-10-CM | POA: Diagnosis not present

## 2018-12-27 DIAGNOSIS — Z961 Presence of intraocular lens: Secondary | ICD-10-CM | POA: Diagnosis not present

## 2018-12-27 DIAGNOSIS — H26491 Other secondary cataract, right eye: Secondary | ICD-10-CM | POA: Diagnosis not present

## 2019-01-03 DIAGNOSIS — H26491 Other secondary cataract, right eye: Secondary | ICD-10-CM | POA: Diagnosis not present

## 2019-01-09 DIAGNOSIS — M9904 Segmental and somatic dysfunction of sacral region: Secondary | ICD-10-CM | POA: Diagnosis not present

## 2019-01-09 DIAGNOSIS — M9905 Segmental and somatic dysfunction of pelvic region: Secondary | ICD-10-CM | POA: Diagnosis not present

## 2019-01-09 DIAGNOSIS — M9903 Segmental and somatic dysfunction of lumbar region: Secondary | ICD-10-CM | POA: Diagnosis not present

## 2019-01-09 DIAGNOSIS — M9902 Segmental and somatic dysfunction of thoracic region: Secondary | ICD-10-CM | POA: Diagnosis not present

## 2019-01-10 DIAGNOSIS — H26491 Other secondary cataract, right eye: Secondary | ICD-10-CM | POA: Diagnosis not present

## 2019-01-10 DIAGNOSIS — H26492 Other secondary cataract, left eye: Secondary | ICD-10-CM | POA: Diagnosis not present

## 2019-01-29 DIAGNOSIS — R69 Illness, unspecified: Secondary | ICD-10-CM | POA: Diagnosis not present

## 2019-01-30 DIAGNOSIS — B351 Tinea unguium: Secondary | ICD-10-CM | POA: Diagnosis not present

## 2019-01-30 DIAGNOSIS — I498 Other specified cardiac arrhythmias: Secondary | ICD-10-CM | POA: Diagnosis not present

## 2019-01-30 DIAGNOSIS — I10 Essential (primary) hypertension: Secondary | ICD-10-CM | POA: Diagnosis not present

## 2019-01-30 DIAGNOSIS — Z6830 Body mass index (BMI) 30.0-30.9, adult: Secondary | ICD-10-CM | POA: Diagnosis not present

## 2019-01-30 DIAGNOSIS — E559 Vitamin D deficiency, unspecified: Secondary | ICD-10-CM | POA: Diagnosis not present

## 2019-01-30 DIAGNOSIS — Z79899 Other long term (current) drug therapy: Secondary | ICD-10-CM | POA: Diagnosis not present

## 2019-01-30 DIAGNOSIS — E0821 Diabetes mellitus due to underlying condition with diabetic nephropathy: Secondary | ICD-10-CM | POA: Diagnosis not present

## 2019-01-30 DIAGNOSIS — Z1331 Encounter for screening for depression: Secondary | ICD-10-CM | POA: Diagnosis not present

## 2019-01-30 DIAGNOSIS — E669 Obesity, unspecified: Secondary | ICD-10-CM | POA: Diagnosis not present

## 2019-01-30 DIAGNOSIS — E785 Hyperlipidemia, unspecified: Secondary | ICD-10-CM | POA: Diagnosis not present

## 2019-02-16 DIAGNOSIS — J209 Acute bronchitis, unspecified: Secondary | ICD-10-CM | POA: Diagnosis not present

## 2019-02-16 DIAGNOSIS — R0981 Nasal congestion: Secondary | ICD-10-CM | POA: Diagnosis not present

## 2019-02-16 DIAGNOSIS — R05 Cough: Secondary | ICD-10-CM | POA: Diagnosis not present

## 2019-03-04 DIAGNOSIS — N481 Balanitis: Secondary | ICD-10-CM | POA: Diagnosis not present

## 2019-03-04 DIAGNOSIS — N401 Enlarged prostate with lower urinary tract symptoms: Secondary | ICD-10-CM | POA: Diagnosis not present

## 2019-03-04 DIAGNOSIS — R972 Elevated prostate specific antigen [PSA]: Secondary | ICD-10-CM | POA: Diagnosis not present

## 2019-04-30 DIAGNOSIS — Z0000 Encounter for general adult medical examination without abnormal findings: Secondary | ICD-10-CM | POA: Diagnosis not present

## 2019-04-30 DIAGNOSIS — Z125 Encounter for screening for malignant neoplasm of prostate: Secondary | ICD-10-CM | POA: Diagnosis not present

## 2019-04-30 DIAGNOSIS — Z6830 Body mass index (BMI) 30.0-30.9, adult: Secondary | ICD-10-CM | POA: Diagnosis not present

## 2019-04-30 DIAGNOSIS — Z1331 Encounter for screening for depression: Secondary | ICD-10-CM | POA: Diagnosis not present

## 2019-04-30 DIAGNOSIS — E785 Hyperlipidemia, unspecified: Secondary | ICD-10-CM | POA: Diagnosis not present

## 2019-04-30 DIAGNOSIS — Z139 Encounter for screening, unspecified: Secondary | ICD-10-CM | POA: Diagnosis not present

## 2019-04-30 DIAGNOSIS — Z9181 History of falling: Secondary | ICD-10-CM | POA: Diagnosis not present

## 2019-05-02 DIAGNOSIS — E669 Obesity, unspecified: Secondary | ICD-10-CM | POA: Diagnosis not present

## 2019-05-02 DIAGNOSIS — E785 Hyperlipidemia, unspecified: Secondary | ICD-10-CM | POA: Diagnosis not present

## 2019-05-02 DIAGNOSIS — E0821 Diabetes mellitus due to underlying condition with diabetic nephropathy: Secondary | ICD-10-CM | POA: Diagnosis not present

## 2019-05-02 DIAGNOSIS — I498 Other specified cardiac arrhythmias: Secondary | ICD-10-CM | POA: Diagnosis not present

## 2019-05-02 DIAGNOSIS — Z6830 Body mass index (BMI) 30.0-30.9, adult: Secondary | ICD-10-CM | POA: Diagnosis not present

## 2019-05-02 DIAGNOSIS — Z8616 Personal history of COVID-19: Secondary | ICD-10-CM | POA: Diagnosis not present

## 2019-05-02 DIAGNOSIS — B351 Tinea unguium: Secondary | ICD-10-CM | POA: Diagnosis not present

## 2019-05-02 DIAGNOSIS — E559 Vitamin D deficiency, unspecified: Secondary | ICD-10-CM | POA: Diagnosis not present

## 2019-05-02 DIAGNOSIS — I10 Essential (primary) hypertension: Secondary | ICD-10-CM | POA: Diagnosis not present

## 2019-05-10 ENCOUNTER — Other Ambulatory Visit: Payer: Self-pay

## 2019-05-10 ENCOUNTER — Encounter: Payer: Self-pay | Admitting: Sports Medicine

## 2019-05-10 ENCOUNTER — Ambulatory Visit (INDEPENDENT_AMBULATORY_CARE_PROVIDER_SITE_OTHER): Payer: Medicare HMO | Admitting: Sports Medicine

## 2019-05-10 DIAGNOSIS — E119 Type 2 diabetes mellitus without complications: Secondary | ICD-10-CM

## 2019-05-10 DIAGNOSIS — B351 Tinea unguium: Secondary | ICD-10-CM

## 2019-05-10 DIAGNOSIS — Z7901 Long term (current) use of anticoagulants: Secondary | ICD-10-CM

## 2019-05-10 DIAGNOSIS — M79675 Pain in left toe(s): Secondary | ICD-10-CM | POA: Diagnosis not present

## 2019-05-10 DIAGNOSIS — M79674 Pain in right toe(s): Secondary | ICD-10-CM | POA: Diagnosis not present

## 2019-05-10 NOTE — Progress Notes (Signed)
Subjective: Samuel Green is a 74 y.o. male patient with history of diabetes who presents to office today complaining of long,mildly painful nails  while ambulating in shoes; unable to trim. Patient states that the glucose reading this morning was not recorded but last night was 170 mg/dl.  Last A1c under 7 and last saw PCP 1 week ago.  Patient denies any new changes in medication or new problems. Patient denies any new cramping, numbness, burning or tingling in the legs.  Review of Systems  All other systems reviewed and are negative.    Patient Active Problem List   Diagnosis Date Noted  . Current use of long term anticoagulation 12/21/2016  . Dyslipidemia 10/01/2015  . Essential hypertension 10/29/2014  . PAF (paroxysmal atrial fibrillation) ([place]) 10/29/2014   Current Outpatient Medications on File Prior to Visit  Medication Sig Dispense Refill  . Albuterol Sulfate 108 (90 Base) MCG/ACT AEPB Inhale 1-2 puffs into the lungs daily as needed.    Marland Kitchen apixaban (ELIQUIS) 5 MG TABS tablet Take 5 mg by mouth 2 (two) times daily.    Marland Kitchen atorvastatin (LIPITOR) 40 MG tablet Take 40 mg by mouth daily.    Marland Kitchen diltiazem (CARDIZEM) 120 MG tablet Take 120 mg by mouth daily.    Marland Kitchen dronedarone (MULTAQ) 400 MG tablet Take 400 mg by mouth 2 (two) times daily.    . finasteride (PROSCAR) 5 MG tablet Take 5 mg by mouth daily.    Marland Kitchen ipratropium (ATROVENT) 0.02 % nebulizer solution Take 500 mcg by nebulization 4 (four) times daily as needed for Wheezing.    Marland Kitchen lisinopril-hydrochlorothiazide (PRINZIDE,ZESTORETIC) 20-25 MG tablet Take 1 tablet by mouth daily.    . metFORMIN (GLUCOPHAGE) 1000 MG tablet Take 1,000 mg by mouth 2 (two) times daily.    Marland Kitchen PARoxetine (PAXIL) 20 MG tablet Take 20 mg by mouth daily.    . tamsulosin (FLOMAX) 0.4 MG CAPS capsule Take 0.4 mg by mouth daily.     No current facility-administered medications on file prior to visit.   Allergies  Allergen Reactions  . Morphine     Other  reaction(s): Other (See Comments) Angry, violent    No results found for this or any previous visit (from the past 2160 hour(s)).  Objective: General: Patient is awake, alert, and oriented x 3 and in no acute distress.  Integument: Skin is warm, dry and supple bilateral. Nails are tender, long, thickened and dystrophic with subungual debris, consistent with onychomycosis, 1-5 bilateral. No signs of infection. No open lesions or preulcerative lesions present bilateral. Remaining integument unremarkable.  Vasculature:  Dorsalis Pedis pulse 1/4 bilateral. Posterior Tibial pulse 1/4 bilateral.  Capillary fill time <3 sec 1-5 bilateral. Positive hair growth to the level of the digits. Temperature gradient within normal limits. No varicosities present bilateral. No edema present bilateral.   Neurology: The patient has intact sensation measured with a 5.07/10g Semmes Weinstein Monofilament at all pedal sites bilateral . Vibratory sensation diminished slightly on the left with tuning fork. No Babinski sign present bilateral.   Musculoskeletal: Asymptomatic pes planus and hammertoe pedal deformities noted bilateral. Muscular strength 5/5 in all lower extremity muscular groups bilateral without pain on range of motion . No tenderness with calf compression bilateral.  Assessment and Plan: Problem List Items Addressed This Visit      Other   Current use of long term anticoagulation    Other Visit Diagnoses    Pain due to onychomycosis of toenails of both feet    -  Primary   Diabetes mellitus without complication (HCC)          -Examined patient. -Discussed and educated patient on diabetic foot care, especially with  regards to the vascular, neurological and musculoskeletal systems.  -Stressed the importance of good glycemic control and the detriment of not  controlling glucose levels in relation to the foot. -Mechanically debrided all nails 1-5 bilateral using sterile nail nipper and filed  with dremel without incident  -Answered all patient questions -Patient to return  in 3 months for at risk foot care -Patient advised to call the office if any problems or questions arise in the meantime.  Asencion Islam, DPM

## 2019-05-22 DIAGNOSIS — I480 Paroxysmal atrial fibrillation: Secondary | ICD-10-CM | POA: Diagnosis not present

## 2019-05-22 DIAGNOSIS — Z7901 Long term (current) use of anticoagulants: Secondary | ICD-10-CM | POA: Diagnosis not present

## 2019-05-22 DIAGNOSIS — I10 Essential (primary) hypertension: Secondary | ICD-10-CM | POA: Diagnosis not present

## 2019-05-30 DIAGNOSIS — R69 Illness, unspecified: Secondary | ICD-10-CM | POA: Diagnosis not present

## 2019-07-03 DIAGNOSIS — I498 Other specified cardiac arrhythmias: Secondary | ICD-10-CM | POA: Diagnosis not present

## 2019-07-03 DIAGNOSIS — E559 Vitamin D deficiency, unspecified: Secondary | ICD-10-CM | POA: Diagnosis not present

## 2019-07-03 DIAGNOSIS — E669 Obesity, unspecified: Secondary | ICD-10-CM | POA: Diagnosis not present

## 2019-07-03 DIAGNOSIS — Z8616 Personal history of COVID-19: Secondary | ICD-10-CM | POA: Diagnosis not present

## 2019-07-03 DIAGNOSIS — E0821 Diabetes mellitus due to underlying condition with diabetic nephropathy: Secondary | ICD-10-CM | POA: Diagnosis not present

## 2019-07-03 DIAGNOSIS — E785 Hyperlipidemia, unspecified: Secondary | ICD-10-CM | POA: Diagnosis not present

## 2019-07-03 DIAGNOSIS — I10 Essential (primary) hypertension: Secondary | ICD-10-CM | POA: Diagnosis not present

## 2019-07-03 DIAGNOSIS — B351 Tinea unguium: Secondary | ICD-10-CM | POA: Diagnosis not present

## 2019-07-03 DIAGNOSIS — Z6830 Body mass index (BMI) 30.0-30.9, adult: Secondary | ICD-10-CM | POA: Diagnosis not present

## 2019-08-09 ENCOUNTER — Encounter: Payer: Self-pay | Admitting: Sports Medicine

## 2019-08-09 ENCOUNTER — Ambulatory Visit (INDEPENDENT_AMBULATORY_CARE_PROVIDER_SITE_OTHER): Payer: Medicare HMO | Admitting: Sports Medicine

## 2019-08-09 ENCOUNTER — Other Ambulatory Visit: Payer: Self-pay

## 2019-08-09 DIAGNOSIS — B351 Tinea unguium: Secondary | ICD-10-CM | POA: Diagnosis not present

## 2019-08-09 DIAGNOSIS — M79675 Pain in left toe(s): Secondary | ICD-10-CM

## 2019-08-09 DIAGNOSIS — E119 Type 2 diabetes mellitus without complications: Secondary | ICD-10-CM

## 2019-08-09 DIAGNOSIS — M79674 Pain in right toe(s): Secondary | ICD-10-CM

## 2019-08-09 DIAGNOSIS — Z7901 Long term (current) use of anticoagulants: Secondary | ICD-10-CM

## 2019-08-09 NOTE — Progress Notes (Signed)
Subjective: Samuel Green is a 74 y.o. male patient with history of diabetes who returns to office today complaining of long,mildly painful nails  while ambulating in shoes; unable to trim. Patient states that the glucose reading this morning was not recorded does not check last A1c 7 and last visit to PCP 3 months ago patient denies any new problems or any other pedal complaints at this time.  Patient Active Problem List   Diagnosis Date Noted  . Current use of long term anticoagulation 12/21/2016  . Dyslipidemia 10/01/2015  . Essential hypertension 10/29/2014  . PAF (paroxysmal atrial fibrillation) ([place]) 10/29/2014   Current Outpatient Medications on File Prior to Visit  Medication Sig Dispense Refill  . Albuterol Sulfate 108 (90 Base) MCG/ACT AEPB Inhale 1-2 puffs into the lungs daily as needed.    Marland Kitchen apixaban (ELIQUIS) 5 MG TABS tablet Take 5 mg by mouth 2 (two) times daily.    Marland Kitchen atorvastatin (LIPITOR) 40 MG tablet Take 40 mg by mouth daily.    Marland Kitchen diltiazem (CARDIZEM) 120 MG tablet Take 120 mg by mouth daily.    Marland Kitchen dronedarone (MULTAQ) 400 MG tablet Take 400 mg by mouth 2 (two) times daily.    . finasteride (PROSCAR) 5 MG tablet Take 5 mg by mouth daily.    Marland Kitchen gabapentin (NEURONTIN) 100 MG capsule Take 100 mg by mouth daily.    Marland Kitchen glimepiride (AMARYL) 4 MG tablet Take 4 mg by mouth every morning.    Marland Kitchen ipratropium (ATROVENT) 0.02 % nebulizer solution Take 500 mcg by nebulization 4 (four) times daily as needed for Wheezing.    Marland Kitchen lisinopril-hydrochlorothiazide (PRINZIDE,ZESTORETIC) 20-25 MG tablet Take 1 tablet by mouth daily.    . metFORMIN (GLUCOPHAGE) 1000 MG tablet Take 1,000 mg by mouth 2 (two) times daily.    Marland Kitchen PARoxetine (PAXIL) 20 MG tablet Take 20 mg by mouth daily.    . tamsulosin (FLOMAX) 0.4 MG CAPS capsule Take 0.4 mg by mouth daily.    . TRULICITY 1.5 OZ/3.6UY SOPN INJECT1 SOLUTION PEN INJECTOR ONCE A WEEK    . Vitamin D, Ergocalciferol, (DRISDOL) 1.25 MG (50000 UNIT) CAPS  capsule Take 50,000 Units by mouth once a week.     No current facility-administered medications on file prior to visit.   Allergies  Allergen Reactions  . Morphine     Other reaction(s): Other (See Comments) Angry, violent    No results found for this or any previous visit (from the past 2160 hour(s)).  Objective: General: Patient is awake, alert, and oriented x 3 and in no acute distress.  Integument: Skin is warm, dry and supple bilateral. Nails are tender, long, thickened and dystrophic with subungual debris, consistent with onychomycosis, 1-5 bilateral. No signs of infection. No open lesions or preulcerative lesions present bilateral. Remaining integument unremarkable.  Vasculature:  Dorsalis Pedis pulse 1/4 bilateral. Posterior Tibial pulse 1/4 bilateral.  Capillary fill time <3 sec 1-5 bilateral. Positive hair growth to the level of the digits. Temperature gradient within normal limits. No varicosities present bilateral. No edema present bilateral.   Neurology: The patient has intact sensation measured with a 5.07/10g Semmes Weinstein Monofilament at all pedal sites bilateral . Vibratory sensation diminished slightly on the left with tuning fork. No Babinski sign present bilateral.   Musculoskeletal: Asymptomatic pes planus and hammertoe pedal deformities noted bilateral. Muscular strength 5/5 in all lower extremity muscular groups bilateral without pain on range of motion . No tenderness with calf compression bilateral.  Assessment and Plan:  Problem List Items Addressed This Visit      Other   Current use of long term anticoagulation    Other Visit Diagnoses    Pain due to onychomycosis of toenails of both feet    -  Primary   Diabetes mellitus without complication (HCC)          -Examined patient. -Re-Discussed and educated patient on diabetic foot care, especially with  regards to the vascular, neurological and musculoskeletal systems.  -Mechanically debrided all  nails 1-5 bilateral using sterile nail nipper and filed with dremel without incident  -Answered all patient questions -Patient to return  in 3 months for at risk foot care -Patient advised to call the office if any problems or questions arise in the meantime.  Asencion Islam, DPM

## 2019-08-13 DIAGNOSIS — Z6830 Body mass index (BMI) 30.0-30.9, adult: Secondary | ICD-10-CM | POA: Diagnosis not present

## 2019-08-13 DIAGNOSIS — D519 Vitamin B12 deficiency anemia, unspecified: Secondary | ICD-10-CM | POA: Diagnosis not present

## 2019-08-13 DIAGNOSIS — E785 Hyperlipidemia, unspecified: Secondary | ICD-10-CM | POA: Diagnosis not present

## 2019-08-13 DIAGNOSIS — Z8616 Personal history of COVID-19: Secondary | ICD-10-CM | POA: Diagnosis not present

## 2019-08-13 DIAGNOSIS — Z79899 Other long term (current) drug therapy: Secondary | ICD-10-CM | POA: Diagnosis not present

## 2019-08-13 DIAGNOSIS — I10 Essential (primary) hypertension: Secondary | ICD-10-CM | POA: Diagnosis not present

## 2019-08-13 DIAGNOSIS — B351 Tinea unguium: Secondary | ICD-10-CM | POA: Diagnosis not present

## 2019-08-13 DIAGNOSIS — E559 Vitamin D deficiency, unspecified: Secondary | ICD-10-CM | POA: Diagnosis not present

## 2019-08-13 DIAGNOSIS — J449 Chronic obstructive pulmonary disease, unspecified: Secondary | ICD-10-CM | POA: Diagnosis not present

## 2019-08-13 DIAGNOSIS — E0821 Diabetes mellitus due to underlying condition with diabetic nephropathy: Secondary | ICD-10-CM | POA: Diagnosis not present

## 2019-08-13 DIAGNOSIS — E669 Obesity, unspecified: Secondary | ICD-10-CM | POA: Diagnosis not present

## 2019-08-13 DIAGNOSIS — I498 Other specified cardiac arrhythmias: Secondary | ICD-10-CM | POA: Diagnosis not present

## 2019-08-25 DIAGNOSIS — R69 Illness, unspecified: Secondary | ICD-10-CM | POA: Diagnosis not present

## 2019-09-02 DIAGNOSIS — N481 Balanitis: Secondary | ICD-10-CM | POA: Diagnosis not present

## 2019-09-02 DIAGNOSIS — N401 Enlarged prostate with lower urinary tract symptoms: Secondary | ICD-10-CM | POA: Diagnosis not present

## 2019-09-02 DIAGNOSIS — R972 Elevated prostate specific antigen [PSA]: Secondary | ICD-10-CM | POA: Diagnosis not present

## 2019-10-05 DIAGNOSIS — R52 Pain, unspecified: Secondary | ICD-10-CM | POA: Diagnosis not present

## 2019-10-05 DIAGNOSIS — R0902 Hypoxemia: Secondary | ICD-10-CM | POA: Diagnosis not present

## 2019-10-05 DIAGNOSIS — E860 Dehydration: Secondary | ICD-10-CM | POA: Diagnosis not present

## 2019-10-05 DIAGNOSIS — T675XXA Heat exhaustion, unspecified, initial encounter: Secondary | ICD-10-CM | POA: Diagnosis not present

## 2019-11-06 DIAGNOSIS — Z6830 Body mass index (BMI) 30.0-30.9, adult: Secondary | ICD-10-CM | POA: Diagnosis not present

## 2019-11-06 DIAGNOSIS — E559 Vitamin D deficiency, unspecified: Secondary | ICD-10-CM | POA: Diagnosis not present

## 2019-11-06 DIAGNOSIS — J449 Chronic obstructive pulmonary disease, unspecified: Secondary | ICD-10-CM | POA: Diagnosis not present

## 2019-11-06 DIAGNOSIS — J309 Allergic rhinitis, unspecified: Secondary | ICD-10-CM | POA: Diagnosis not present

## 2019-11-06 DIAGNOSIS — E0821 Diabetes mellitus due to underlying condition with diabetic nephropathy: Secondary | ICD-10-CM | POA: Diagnosis not present

## 2019-11-06 DIAGNOSIS — E669 Obesity, unspecified: Secondary | ICD-10-CM | POA: Diagnosis not present

## 2019-11-06 DIAGNOSIS — I10 Essential (primary) hypertension: Secondary | ICD-10-CM | POA: Diagnosis not present

## 2019-11-06 DIAGNOSIS — E785 Hyperlipidemia, unspecified: Secondary | ICD-10-CM | POA: Diagnosis not present

## 2019-11-06 DIAGNOSIS — B351 Tinea unguium: Secondary | ICD-10-CM | POA: Diagnosis not present

## 2019-11-06 DIAGNOSIS — I498 Other specified cardiac arrhythmias: Secondary | ICD-10-CM | POA: Diagnosis not present

## 2019-11-08 ENCOUNTER — Ambulatory Visit: Payer: Medicare HMO | Admitting: Sports Medicine

## 2019-11-13 ENCOUNTER — Other Ambulatory Visit: Payer: Self-pay

## 2019-11-13 ENCOUNTER — Ambulatory Visit (INDEPENDENT_AMBULATORY_CARE_PROVIDER_SITE_OTHER): Payer: Medicare HMO | Admitting: Sports Medicine

## 2019-11-13 ENCOUNTER — Encounter: Payer: Self-pay | Admitting: Sports Medicine

## 2019-11-13 DIAGNOSIS — M79674 Pain in right toe(s): Secondary | ICD-10-CM

## 2019-11-13 DIAGNOSIS — M79675 Pain in left toe(s): Secondary | ICD-10-CM | POA: Diagnosis not present

## 2019-11-13 DIAGNOSIS — Z7901 Long term (current) use of anticoagulants: Secondary | ICD-10-CM

## 2019-11-13 DIAGNOSIS — B351 Tinea unguium: Secondary | ICD-10-CM

## 2019-11-13 DIAGNOSIS — E119 Type 2 diabetes mellitus without complications: Secondary | ICD-10-CM | POA: Diagnosis not present

## 2019-11-13 NOTE — Progress Notes (Signed)
Subjective: Samuel Green is a 74 y.o. male patient with history of diabetes who returns to office today complaining of long,mildly painful nails  while ambulating in shoes; unable to trim. Patient states that the glucose reading this morning was not recorded but yesterday was 156 and last A1c 7.1 and last visit to PCP [place] x few weeks ago. No new issues noted.  Patient Active Problem List   Diagnosis Date Noted   Current use of long term anticoagulation 12/21/2016   Dyslipidemia 10/01/2015   Essential hypertension 10/29/2014   PAF (paroxysmal atrial fibrillation) (HCC) 10/29/2014   Current Outpatient Medications on File Prior to Visit  Medication Sig Dispense Refill   Albuterol Sulfate 108 (90 Base) MCG/ACT AEPB Inhale 1-2 puffs into the lungs daily as needed.     apixaban (ELIQUIS) 5 MG TABS tablet Take 5 mg by mouth 2 (two) times daily.     atorvastatin (LIPITOR) 40 MG tablet Take 40 mg by mouth daily.     diltiazem (CARDIZEM) 120 MG tablet Take 120 mg by mouth daily.     dronedarone (MULTAQ) 400 MG tablet Take 400 mg by mouth 2 (two) times daily.     finasteride (PROSCAR) 5 MG tablet Take 5 mg by mouth daily.     gabapentin (NEURONTIN) 100 MG capsule Take 100 mg by mouth daily.     glimepiride (AMARYL) 4 MG tablet Take 4 mg by mouth every morning.     ipratropium (ATROVENT) 0.02 % nebulizer solution Take 500 mcg by nebulization 4 (four) times daily as needed for Wheezing.     lisinopril-hydrochlorothiazide (PRINZIDE,ZESTORETIC) 20-25 MG tablet Take 1 tablet by mouth daily.     metFORMIN (GLUCOPHAGE) 1000 MG tablet Take 1,000 mg by mouth 2 (two) times daily.     PARoxetine (PAXIL) 20 MG tablet Take 20 mg by mouth daily.     tamsulosin (FLOMAX) 0.4 MG CAPS capsule Take 0.4 mg by mouth daily.     TRULICITY 1.5 MG/0.5ML SOPN INJECT1 SOLUTION PEN INJECTOR ONCE A WEEK     Vitamin D, Ergocalciferol, (DRISDOL) 1.25 MG (50000 UNIT) CAPS capsule Take 50,000 Units by  mouth once a week.     No current facility-administered medications on file prior to visit.   Allergies  Allergen Reactions   Morphine     Other reaction(s): Other (See Comments) Angry, violent    No results found for this or any previous visit (from the past 2160 hour(s)).  Objective: General: Patient is awake, alert, and oriented x 3 and in no acute distress.  Integument: Skin is warm, dry and supple bilateral. Nails are tender, long, thickened and dystrophic with subungual debris, consistent with onychomycosis, 1-5 bilateral. No signs of infection. No open lesions or preulcerative lesions present bilateral. Remaining integument unremarkable.  Vasculature:  Dorsalis Pedis pulse 1/4 bilateral. Posterior Tibial pulse 1/4 bilateral.  Capillary fill time <3 sec 1-5 bilateral. Positive hair growth to the level of the digits. Temperature gradient within normal limits. No varicosities present bilateral. No edema present bilateral.   Neurology: The patient has intact sensation measured with a 5.07/10g Semmes Weinstein Monofilament at all pedal sites bilateral . Vibratory sensation diminished slightly on the left with tuning fork. No Babinski sign present bilateral.   Musculoskeletal: Asymptomatic pes planus and hammertoe pedal deformities noted bilateral. Muscular strength 5/5 in all lower extremity muscular groups bilateral without pain on range of motion . No tenderness with calf compression bilateral.  Assessment and Plan: Problem List Items Addressed This  Visit      Other   Current use of long term anticoagulation    Other Visit Diagnoses    Pain due to onychomycosis of toenails of both feet    -  Primary   Diabetes mellitus without complication (HCC)          -Examined patient. -Re-Discussed the importance of daily foot inspection in the setting of diabetes  -Mechanically debrided all nails 1-5 bilateral using sterile nail nipper and filed with dremel without incident   -Answered all patient questions -Patient to return  in 3 months for at risk foot care -Patient advised to call the office if any problems or questions arise in the meantime.  Asencion Islam, DPM

## 2019-11-18 DIAGNOSIS — M9903 Segmental and somatic dysfunction of lumbar region: Secondary | ICD-10-CM | POA: Diagnosis not present

## 2019-11-18 DIAGNOSIS — M9905 Segmental and somatic dysfunction of pelvic region: Secondary | ICD-10-CM | POA: Diagnosis not present

## 2019-11-18 DIAGNOSIS — M9904 Segmental and somatic dysfunction of sacral region: Secondary | ICD-10-CM | POA: Diagnosis not present

## 2019-11-18 DIAGNOSIS — M9902 Segmental and somatic dysfunction of thoracic region: Secondary | ICD-10-CM | POA: Diagnosis not present

## 2019-11-20 DIAGNOSIS — M9905 Segmental and somatic dysfunction of pelvic region: Secondary | ICD-10-CM | POA: Diagnosis not present

## 2019-11-20 DIAGNOSIS — M9904 Segmental and somatic dysfunction of sacral region: Secondary | ICD-10-CM | POA: Diagnosis not present

## 2019-11-20 DIAGNOSIS — Z7901 Long term (current) use of anticoagulants: Secondary | ICD-10-CM | POA: Diagnosis not present

## 2019-11-20 DIAGNOSIS — I480 Paroxysmal atrial fibrillation: Secondary | ICD-10-CM | POA: Diagnosis not present

## 2019-11-20 DIAGNOSIS — M9902 Segmental and somatic dysfunction of thoracic region: Secondary | ICD-10-CM | POA: Diagnosis not present

## 2019-11-20 DIAGNOSIS — M9903 Segmental and somatic dysfunction of lumbar region: Secondary | ICD-10-CM | POA: Diagnosis not present

## 2019-11-20 DIAGNOSIS — I10 Essential (primary) hypertension: Secondary | ICD-10-CM | POA: Diagnosis not present

## 2019-11-22 DIAGNOSIS — M9903 Segmental and somatic dysfunction of lumbar region: Secondary | ICD-10-CM | POA: Diagnosis not present

## 2019-11-22 DIAGNOSIS — M9904 Segmental and somatic dysfunction of sacral region: Secondary | ICD-10-CM | POA: Diagnosis not present

## 2019-11-22 DIAGNOSIS — M9905 Segmental and somatic dysfunction of pelvic region: Secondary | ICD-10-CM | POA: Diagnosis not present

## 2019-11-22 DIAGNOSIS — M9902 Segmental and somatic dysfunction of thoracic region: Secondary | ICD-10-CM | POA: Diagnosis not present

## 2019-11-25 DIAGNOSIS — M9905 Segmental and somatic dysfunction of pelvic region: Secondary | ICD-10-CM | POA: Diagnosis not present

## 2019-11-25 DIAGNOSIS — M9903 Segmental and somatic dysfunction of lumbar region: Secondary | ICD-10-CM | POA: Diagnosis not present

## 2019-11-25 DIAGNOSIS — M9902 Segmental and somatic dysfunction of thoracic region: Secondary | ICD-10-CM | POA: Diagnosis not present

## 2019-11-25 DIAGNOSIS — M9904 Segmental and somatic dysfunction of sacral region: Secondary | ICD-10-CM | POA: Diagnosis not present

## 2019-11-27 DIAGNOSIS — R69 Illness, unspecified: Secondary | ICD-10-CM | POA: Diagnosis not present

## 2019-12-06 DIAGNOSIS — M9902 Segmental and somatic dysfunction of thoracic region: Secondary | ICD-10-CM | POA: Diagnosis not present

## 2019-12-06 DIAGNOSIS — M9904 Segmental and somatic dysfunction of sacral region: Secondary | ICD-10-CM | POA: Diagnosis not present

## 2019-12-06 DIAGNOSIS — M9903 Segmental and somatic dysfunction of lumbar region: Secondary | ICD-10-CM | POA: Diagnosis not present

## 2019-12-06 DIAGNOSIS — M9905 Segmental and somatic dysfunction of pelvic region: Secondary | ICD-10-CM | POA: Diagnosis not present

## 2020-01-05 DIAGNOSIS — R079 Chest pain, unspecified: Secondary | ICD-10-CM | POA: Diagnosis not present

## 2020-01-05 DIAGNOSIS — R1011 Right upper quadrant pain: Secondary | ICD-10-CM | POA: Diagnosis not present

## 2020-01-05 DIAGNOSIS — I213 ST elevation (STEMI) myocardial infarction of unspecified site: Secondary | ICD-10-CM | POA: Diagnosis not present

## 2020-01-05 DIAGNOSIS — I249 Acute ischemic heart disease, unspecified: Secondary | ICD-10-CM | POA: Diagnosis not present

## 2020-01-05 DIAGNOSIS — J449 Chronic obstructive pulmonary disease, unspecified: Secondary | ICD-10-CM | POA: Diagnosis not present

## 2020-01-05 DIAGNOSIS — I700 Atherosclerosis of aorta: Secondary | ICD-10-CM | POA: Diagnosis not present

## 2020-01-05 DIAGNOSIS — R05 Cough: Secondary | ICD-10-CM | POA: Diagnosis not present

## 2020-01-05 DIAGNOSIS — N400 Enlarged prostate without lower urinary tract symptoms: Secondary | ICD-10-CM | POA: Diagnosis not present

## 2020-01-06 DIAGNOSIS — I1310 Hypertensive heart and chronic kidney disease without heart failure, with stage 1 through stage 4 chronic kidney disease, or unspecified chronic kidney disease: Secondary | ICD-10-CM

## 2020-01-06 DIAGNOSIS — R05 Cough: Secondary | ICD-10-CM | POA: Diagnosis not present

## 2020-01-06 DIAGNOSIS — I501 Left ventricular failure: Secondary | ICD-10-CM | POA: Diagnosis not present

## 2020-01-06 DIAGNOSIS — K219 Gastro-esophageal reflux disease without esophagitis: Secondary | ICD-10-CM | POA: Diagnosis not present

## 2020-01-06 DIAGNOSIS — N400 Enlarged prostate without lower urinary tract symptoms: Secondary | ICD-10-CM | POA: Diagnosis not present

## 2020-01-06 DIAGNOSIS — I213 ST elevation (STEMI) myocardial infarction of unspecified site: Secondary | ICD-10-CM | POA: Diagnosis not present

## 2020-01-06 DIAGNOSIS — J449 Chronic obstructive pulmonary disease, unspecified: Secondary | ICD-10-CM | POA: Diagnosis not present

## 2020-01-06 DIAGNOSIS — Z8673 Personal history of transient ischemic attack (TIA), and cerebral infarction without residual deficits: Secondary | ICD-10-CM | POA: Diagnosis not present

## 2020-01-06 DIAGNOSIS — I509 Heart failure, unspecified: Secondary | ICD-10-CM | POA: Diagnosis not present

## 2020-01-06 DIAGNOSIS — I249 Acute ischemic heart disease, unspecified: Secondary | ICD-10-CM | POA: Diagnosis not present

## 2020-01-06 DIAGNOSIS — Z955 Presence of coronary angioplasty implant and graft: Secondary | ICD-10-CM | POA: Diagnosis not present

## 2020-01-06 DIAGNOSIS — R1011 Right upper quadrant pain: Secondary | ICD-10-CM | POA: Diagnosis not present

## 2020-01-06 DIAGNOSIS — I959 Hypotension, unspecified: Secondary | ICD-10-CM | POA: Diagnosis not present

## 2020-01-06 DIAGNOSIS — Z87440 Personal history of urinary (tract) infections: Secondary | ICD-10-CM | POA: Diagnosis not present

## 2020-01-06 DIAGNOSIS — R0789 Other chest pain: Secondary | ICD-10-CM | POA: Diagnosis not present

## 2020-01-06 DIAGNOSIS — R9431 Abnormal electrocardiogram [ECG] [EKG]: Secondary | ICD-10-CM | POA: Diagnosis not present

## 2020-01-06 DIAGNOSIS — E785 Hyperlipidemia, unspecified: Secondary | ICD-10-CM | POA: Diagnosis not present

## 2020-01-06 DIAGNOSIS — I2510 Atherosclerotic heart disease of native coronary artery without angina pectoris: Secondary | ICD-10-CM | POA: Diagnosis not present

## 2020-01-06 DIAGNOSIS — E119 Type 2 diabetes mellitus without complications: Secondary | ICD-10-CM | POA: Diagnosis not present

## 2020-01-06 DIAGNOSIS — R079 Chest pain, unspecified: Secondary | ICD-10-CM | POA: Diagnosis not present

## 2020-01-06 DIAGNOSIS — I4891 Unspecified atrial fibrillation: Secondary | ICD-10-CM | POA: Diagnosis not present

## 2020-01-06 DIAGNOSIS — I4819 Other persistent atrial fibrillation: Secondary | ICD-10-CM | POA: Diagnosis not present

## 2020-01-06 DIAGNOSIS — I5021 Acute systolic (congestive) heart failure: Secondary | ICD-10-CM | POA: Diagnosis not present

## 2020-01-06 DIAGNOSIS — I2102 ST elevation (STEMI) myocardial infarction involving left anterior descending coronary artery: Secondary | ICD-10-CM | POA: Diagnosis not present

## 2020-01-06 DIAGNOSIS — I129 Hypertensive chronic kidney disease with stage 1 through stage 4 chronic kidney disease, or unspecified chronic kidney disease: Secondary | ICD-10-CM | POA: Diagnosis not present

## 2020-01-06 DIAGNOSIS — I480 Paroxysmal atrial fibrillation: Secondary | ICD-10-CM | POA: Diagnosis not present

## 2020-01-06 DIAGNOSIS — I110 Hypertensive heart disease with heart failure: Secondary | ICD-10-CM | POA: Diagnosis not present

## 2020-01-06 DIAGNOSIS — I10 Essential (primary) hypertension: Secondary | ICD-10-CM | POA: Diagnosis not present

## 2020-01-06 DIAGNOSIS — Z7901 Long term (current) use of anticoagulants: Secondary | ICD-10-CM | POA: Diagnosis not present

## 2020-01-06 DIAGNOSIS — R69 Illness, unspecified: Secondary | ICD-10-CM | POA: Diagnosis not present

## 2020-01-06 DIAGNOSIS — I700 Atherosclerosis of aorta: Secondary | ICD-10-CM | POA: Diagnosis not present

## 2020-01-06 DIAGNOSIS — E7849 Other hyperlipidemia: Secondary | ICD-10-CM | POA: Diagnosis not present

## 2020-01-06 DIAGNOSIS — N183 Chronic kidney disease, stage 3 unspecified: Secondary | ICD-10-CM | POA: Diagnosis not present

## 2020-01-06 DIAGNOSIS — I2109 ST elevation (STEMI) myocardial infarction involving other coronary artery of anterior wall: Secondary | ICD-10-CM | POA: Diagnosis not present

## 2020-01-07 DIAGNOSIS — Z955 Presence of coronary angioplasty implant and graft: Secondary | ICD-10-CM | POA: Diagnosis not present

## 2020-01-07 DIAGNOSIS — Z7901 Long term (current) use of anticoagulants: Secondary | ICD-10-CM | POA: Diagnosis not present

## 2020-01-07 DIAGNOSIS — I959 Hypotension, unspecified: Secondary | ICD-10-CM | POA: Diagnosis not present

## 2020-01-07 DIAGNOSIS — I2510 Atherosclerotic heart disease of native coronary artery without angina pectoris: Secondary | ICD-10-CM | POA: Diagnosis not present

## 2020-01-07 DIAGNOSIS — I4819 Other persistent atrial fibrillation: Secondary | ICD-10-CM | POA: Diagnosis not present

## 2020-01-07 DIAGNOSIS — E785 Hyperlipidemia, unspecified: Secondary | ICD-10-CM | POA: Diagnosis not present

## 2020-01-07 DIAGNOSIS — I501 Left ventricular failure: Secondary | ICD-10-CM | POA: Diagnosis not present

## 2020-01-07 DIAGNOSIS — I2102 ST elevation (STEMI) myocardial infarction involving left anterior descending coronary artery: Secondary | ICD-10-CM | POA: Diagnosis not present

## 2020-01-07 DIAGNOSIS — E119 Type 2 diabetes mellitus without complications: Secondary | ICD-10-CM | POA: Diagnosis not present

## 2020-01-08 DIAGNOSIS — I5021 Acute systolic (congestive) heart failure: Secondary | ICD-10-CM | POA: Diagnosis not present

## 2020-01-08 DIAGNOSIS — E119 Type 2 diabetes mellitus without complications: Secondary | ICD-10-CM | POA: Diagnosis not present

## 2020-01-08 DIAGNOSIS — I110 Hypertensive heart disease with heart failure: Secondary | ICD-10-CM | POA: Diagnosis not present

## 2020-01-08 DIAGNOSIS — I2102 ST elevation (STEMI) myocardial infarction involving left anterior descending coronary artery: Secondary | ICD-10-CM | POA: Diagnosis not present

## 2020-01-08 DIAGNOSIS — J449 Chronic obstructive pulmonary disease, unspecified: Secondary | ICD-10-CM | POA: Diagnosis not present

## 2020-01-08 DIAGNOSIS — I480 Paroxysmal atrial fibrillation: Secondary | ICD-10-CM | POA: Diagnosis not present

## 2020-01-08 DIAGNOSIS — I2510 Atherosclerotic heart disease of native coronary artery without angina pectoris: Secondary | ICD-10-CM | POA: Diagnosis not present

## 2020-01-08 DIAGNOSIS — Z955 Presence of coronary angioplasty implant and graft: Secondary | ICD-10-CM | POA: Diagnosis not present

## 2020-01-09 DIAGNOSIS — E119 Type 2 diabetes mellitus without complications: Secondary | ICD-10-CM | POA: Diagnosis not present

## 2020-01-09 DIAGNOSIS — E7849 Other hyperlipidemia: Secondary | ICD-10-CM | POA: Diagnosis not present

## 2020-01-09 DIAGNOSIS — Z955 Presence of coronary angioplasty implant and graft: Secondary | ICD-10-CM | POA: Diagnosis not present

## 2020-01-09 DIAGNOSIS — I2510 Atherosclerotic heart disease of native coronary artery without angina pectoris: Secondary | ICD-10-CM | POA: Diagnosis not present

## 2020-01-09 DIAGNOSIS — I480 Paroxysmal atrial fibrillation: Secondary | ICD-10-CM | POA: Diagnosis not present

## 2020-01-09 DIAGNOSIS — I110 Hypertensive heart disease with heart failure: Secondary | ICD-10-CM | POA: Diagnosis not present

## 2020-01-09 DIAGNOSIS — I2102 ST elevation (STEMI) myocardial infarction involving left anterior descending coronary artery: Secondary | ICD-10-CM | POA: Diagnosis not present

## 2020-01-09 DIAGNOSIS — I509 Heart failure, unspecified: Secondary | ICD-10-CM | POA: Diagnosis not present

## 2020-01-10 DIAGNOSIS — I480 Paroxysmal atrial fibrillation: Secondary | ICD-10-CM | POA: Diagnosis not present

## 2020-01-10 DIAGNOSIS — E785 Hyperlipidemia, unspecified: Secondary | ICD-10-CM | POA: Diagnosis not present

## 2020-01-10 DIAGNOSIS — Z955 Presence of coronary angioplasty implant and graft: Secondary | ICD-10-CM | POA: Diagnosis not present

## 2020-01-10 DIAGNOSIS — I2102 ST elevation (STEMI) myocardial infarction involving left anterior descending coronary artery: Secondary | ICD-10-CM | POA: Diagnosis not present

## 2020-01-10 DIAGNOSIS — E119 Type 2 diabetes mellitus without complications: Secondary | ICD-10-CM | POA: Diagnosis not present

## 2020-01-10 DIAGNOSIS — J449 Chronic obstructive pulmonary disease, unspecified: Secondary | ICD-10-CM | POA: Diagnosis not present

## 2020-01-10 DIAGNOSIS — I10 Essential (primary) hypertension: Secondary | ICD-10-CM | POA: Diagnosis not present

## 2020-01-10 DIAGNOSIS — I2510 Atherosclerotic heart disease of native coronary artery without angina pectoris: Secondary | ICD-10-CM | POA: Diagnosis not present

## 2020-01-24 DIAGNOSIS — I519 Heart disease, unspecified: Secondary | ICD-10-CM | POA: Insufficient documentation

## 2020-01-24 DIAGNOSIS — I2510 Atherosclerotic heart disease of native coronary artery without angina pectoris: Secondary | ICD-10-CM | POA: Insufficient documentation

## 2020-01-24 DIAGNOSIS — E7849 Other hyperlipidemia: Secondary | ICD-10-CM | POA: Insufficient documentation

## 2020-02-14 ENCOUNTER — Ambulatory Visit (INDEPENDENT_AMBULATORY_CARE_PROVIDER_SITE_OTHER): Payer: Medicare HMO | Admitting: Sports Medicine

## 2020-02-14 ENCOUNTER — Encounter: Payer: Self-pay | Admitting: Sports Medicine

## 2020-02-14 ENCOUNTER — Other Ambulatory Visit: Payer: Self-pay

## 2020-02-14 DIAGNOSIS — M79674 Pain in right toe(s): Secondary | ICD-10-CM

## 2020-02-14 DIAGNOSIS — E119 Type 2 diabetes mellitus without complications: Secondary | ICD-10-CM | POA: Diagnosis not present

## 2020-02-14 DIAGNOSIS — M79675 Pain in left toe(s): Secondary | ICD-10-CM | POA: Diagnosis not present

## 2020-02-14 DIAGNOSIS — Z7901 Long term (current) use of anticoagulants: Secondary | ICD-10-CM

## 2020-02-14 DIAGNOSIS — B351 Tinea unguium: Secondary | ICD-10-CM

## 2020-02-14 NOTE — Progress Notes (Signed)
Subjective: Samuel Green is a 74 y.o. male patient with history of diabetes who returns to office today complaining of long,mildly painful nails  while ambulating in shoes; unable to trim. Patient states his blood sugars are doing good.  Reports that he did have history of a heart attack since his last visit remains on Eliquis.  Otherwise he is doing good.  Patient Active Problem List   Diagnosis Date Noted  . Coronary artery disease involving native coronary artery of native heart without angina pectoris 01/24/2020  . Left ventricular dysfunction 01/24/2020  . Other hyperlipidemia 01/24/2020  . ST elevation myocardial infarction (STEMI) (HCC) 01/06/2020  . Current use of long term anticoagulation 12/21/2016  . Dyslipidemia 10/01/2015  . Essential hypertension 10/29/2014  . PAF (paroxysmal atrial fibrillation) (HCC) 10/29/2014   Current Outpatient Medications on File Prior to Visit  Medication Sig Dispense Refill  . Albuterol Sulfate 108 (90 Base) MCG/ACT AEPB Inhale 1-2 puffs into the lungs daily as needed.    Marland Kitchen apixaban (ELIQUIS) 5 MG TABS tablet Take 5 mg by mouth 2 (two) times daily.    Marland Kitchen atorvastatin (LIPITOR) 40 MG tablet Take 40 mg by mouth daily.    Marland Kitchen diltiazem (CARDIZEM) 120 MG tablet Take 120 mg by mouth daily.    Marland Kitchen dronedarone (MULTAQ) 400 MG tablet Take 400 mg by mouth 2 (two) times daily.    . finasteride (PROSCAR) 5 MG tablet Take 5 mg by mouth daily.    Marland Kitchen gabapentin (NEURONTIN) 100 MG capsule Take 100 mg by mouth daily.    Marland Kitchen glimepiride (AMARYL) 4 MG tablet Take 4 mg by mouth every morning.    Marland Kitchen ipratropium (ATROVENT) 0.02 % nebulizer solution Take 500 mcg by nebulization 4 (four) times daily as needed for Wheezing.    Marland Kitchen lisinopril-hydrochlorothiazide (PRINZIDE,ZESTORETIC) 20-25 MG tablet Take 1 tablet by mouth daily.    . metFORMIN (GLUCOPHAGE) 1000 MG tablet Take 1,000 mg by mouth 2 (two) times daily.    Marland Kitchen PARoxetine (PAXIL) 20 MG tablet Take 20 mg by mouth daily.     . tamsulosin (FLOMAX) 0.4 MG CAPS capsule Take 0.4 mg by mouth daily.    . TRULICITY 1.5 MG/0.5ML SOPN INJECT1 SOLUTION PEN INJECTOR ONCE A WEEK    . Vitamin D, Ergocalciferol, (DRISDOL) 1.25 MG (50000 UNIT) CAPS capsule Take 50,000 Units by mouth once a week.     No current facility-administered medications on file prior to visit.   Allergies  Allergen Reactions  . Morphine     Other reaction(s): Other (See Comments) Angry, violent    No results found for this or any previous visit (from the past 2160 hour(s)).  Objective: General: Patient is awake, alert, and oriented x 3 and in no acute distress.  Integument: Skin is warm, dry and supple bilateral. Nails are tender, long, thickened and dystrophic with subungual debris, consistent with onychomycosis, 1-5 bilateral. No signs of infection. No open lesions or preulcerative lesions present bilateral. Remaining integument unremarkable.  Vasculature:  Dorsalis Pedis pulse 1/4 bilateral. Posterior Tibial pulse 1/4 bilateral.  Capillary fill time <3 sec 1-5 bilateral. Positive hair growth to the level of the digits. Temperature gradient within normal limits. No varicosities present bilateral. No edema present bilateral.   Neurology: The patient has intact sensation measured with a 5.07/10g Semmes Weinstein Monofilament at all pedal sites bilateral . Vibratory sensation diminished slightly on the left with tuning fork. No Babinski sign present bilateral.   Musculoskeletal: Asymptomatic pes planus and hammertoe pedal  deformities noted bilateral. Muscular strength 5/5 in all lower extremity muscular groups bilateral without pain on range of motion . No tenderness with calf compression bilateral.  Assessment and Plan: Problem List Items Addressed This Visit      Other   Current use of long term anticoagulation    Other Visit Diagnoses    Pain due to onychomycosis of toenails of both feet    -  Primary   Diabetes mellitus without  complication (HCC)          -Examined patient. -Re-Discussed the importance of daily foot inspection in the setting of diabetes  -Mechanically debrided all nails 1-5 bilateral using sterile nail nipper and filed with dremel without incident  -Continue with cardiac follow-up -Answered all patient questions -Patient to return  in 3 months for at risk foot care -Patient advised to call the office if any problems or questions arise in the meantime.  Asencion Islam, DPM

## 2020-03-02 DIAGNOSIS — Z955 Presence of coronary angioplasty implant and graft: Secondary | ICD-10-CM | POA: Insufficient documentation

## 2020-03-16 ENCOUNTER — Other Ambulatory Visit: Payer: Self-pay | Admitting: Family Medicine

## 2020-03-16 DIAGNOSIS — R972 Elevated prostate specific antigen [PSA]: Secondary | ICD-10-CM

## 2020-03-24 ENCOUNTER — Other Ambulatory Visit: Payer: Self-pay | Admitting: Family Medicine

## 2020-03-24 DIAGNOSIS — R972 Elevated prostate specific antigen [PSA]: Secondary | ICD-10-CM

## 2020-04-13 ENCOUNTER — Ambulatory Visit
Admission: RE | Admit: 2020-04-13 | Discharge: 2020-04-13 | Disposition: A | Discharge status: Home / Self Care | Payer: Medicare HMO | Source: Ambulatory Visit | Attending: Family Medicine | Admitting: Family Medicine

## 2020-04-13 ENCOUNTER — Other Ambulatory Visit: Payer: Self-pay

## 2020-04-13 ENCOUNTER — Ambulatory Visit: Admission: RE | Admit: 2020-04-13 | Payer: Medicare HMO | Source: Ambulatory Visit

## 2020-04-13 DIAGNOSIS — R972 Elevated prostate specific antigen [PSA]: Secondary | ICD-10-CM

## 2020-04-13 MED ORDER — GADOBENATE DIMEGLUMINE 529 MG/ML IV SOLN
20.0000 mL | Freq: Once | INTRAVENOUS | Status: AC | PRN
  Administered 2020-04-13: 20 mL via INTRAVENOUS

## 2020-04-24 DIAGNOSIS — I440 Atrioventricular block, first degree: Secondary | ICD-10-CM | POA: Diagnosis not present

## 2020-04-24 DIAGNOSIS — I480 Paroxysmal atrial fibrillation: Secondary | ICD-10-CM | POA: Diagnosis not present

## 2020-04-24 DIAGNOSIS — I519 Heart disease, unspecified: Secondary | ICD-10-CM | POA: Diagnosis not present

## 2020-04-24 DIAGNOSIS — I252 Old myocardial infarction: Secondary | ICD-10-CM | POA: Diagnosis not present

## 2020-04-24 DIAGNOSIS — I2510 Atherosclerotic heart disease of native coronary artery without angina pectoris: Secondary | ICD-10-CM | POA: Diagnosis not present

## 2020-04-24 DIAGNOSIS — I4891 Unspecified atrial fibrillation: Secondary | ICD-10-CM | POA: Diagnosis not present

## 2020-04-24 DIAGNOSIS — E785 Hyperlipidemia, unspecified: Secondary | ICD-10-CM | POA: Diagnosis not present

## 2020-04-24 DIAGNOSIS — Z7901 Long term (current) use of anticoagulants: Secondary | ICD-10-CM | POA: Diagnosis not present

## 2020-05-01 DIAGNOSIS — Z0000 Encounter for general adult medical examination without abnormal findings: Secondary | ICD-10-CM | POA: Diagnosis not present

## 2020-05-01 DIAGNOSIS — Z1331 Encounter for screening for depression: Secondary | ICD-10-CM | POA: Diagnosis not present

## 2020-05-01 DIAGNOSIS — E785 Hyperlipidemia, unspecified: Secondary | ICD-10-CM | POA: Diagnosis not present

## 2020-05-01 DIAGNOSIS — Z9181 History of falling: Secondary | ICD-10-CM | POA: Diagnosis not present

## 2020-05-12 DIAGNOSIS — B351 Tinea unguium: Secondary | ICD-10-CM | POA: Diagnosis not present

## 2020-05-12 DIAGNOSIS — Z6828 Body mass index (BMI) 28.0-28.9, adult: Secondary | ICD-10-CM | POA: Diagnosis not present

## 2020-05-12 DIAGNOSIS — I2510 Atherosclerotic heart disease of native coronary artery without angina pectoris: Secondary | ICD-10-CM | POA: Diagnosis not present

## 2020-05-12 DIAGNOSIS — E669 Obesity, unspecified: Secondary | ICD-10-CM | POA: Diagnosis not present

## 2020-05-12 DIAGNOSIS — I10 Essential (primary) hypertension: Secondary | ICD-10-CM | POA: Diagnosis not present

## 2020-05-12 DIAGNOSIS — E785 Hyperlipidemia, unspecified: Secondary | ICD-10-CM | POA: Diagnosis not present

## 2020-05-12 DIAGNOSIS — Z79899 Other long term (current) drug therapy: Secondary | ICD-10-CM | POA: Diagnosis not present

## 2020-05-12 DIAGNOSIS — E559 Vitamin D deficiency, unspecified: Secondary | ICD-10-CM | POA: Diagnosis not present

## 2020-05-12 DIAGNOSIS — E0821 Diabetes mellitus due to underlying condition with diabetic nephropathy: Secondary | ICD-10-CM | POA: Diagnosis not present

## 2020-05-12 DIAGNOSIS — I498 Other specified cardiac arrhythmias: Secondary | ICD-10-CM | POA: Diagnosis not present

## 2020-05-12 DIAGNOSIS — M19012 Primary osteoarthritis, left shoulder: Secondary | ICD-10-CM | POA: Diagnosis not present

## 2020-05-12 DIAGNOSIS — G8929 Other chronic pain: Secondary | ICD-10-CM | POA: Diagnosis not present

## 2020-05-20 ENCOUNTER — Other Ambulatory Visit: Payer: Self-pay

## 2020-05-20 ENCOUNTER — Encounter: Payer: Self-pay | Admitting: Sports Medicine

## 2020-05-20 ENCOUNTER — Ambulatory Visit (INDEPENDENT_AMBULATORY_CARE_PROVIDER_SITE_OTHER): Payer: Medicare HMO | Admitting: Sports Medicine

## 2020-05-20 DIAGNOSIS — B351 Tinea unguium: Secondary | ICD-10-CM

## 2020-05-20 DIAGNOSIS — M79675 Pain in left toe(s): Secondary | ICD-10-CM | POA: Diagnosis not present

## 2020-05-20 DIAGNOSIS — M79674 Pain in right toe(s): Secondary | ICD-10-CM

## 2020-05-20 DIAGNOSIS — Z7901 Long term (current) use of anticoagulants: Secondary | ICD-10-CM

## 2020-05-20 DIAGNOSIS — E119 Type 2 diabetes mellitus without complications: Secondary | ICD-10-CM

## 2020-05-20 NOTE — Progress Notes (Signed)
Subjective: Samuel Green is a 75 y.o. male patient with history of diabetes who returns to office today complaining of long,mildly painful nails  while ambulating in shoes; unable to trim. Patient states his blood sugars are doing good.  Reports that he is still seeing cardiology and is still on Eliquis.  No other issues noted.  Fasting blood sugar not recorded A1c not recorded PCP visit 3 months ago  Patient Active Problem List   Diagnosis Date Noted  . H/O heart artery stent 03/02/2020  . Coronary artery disease involving native coronary artery of native heart without angina pectoris 01/24/2020  . Left ventricular dysfunction 01/24/2020  . Other hyperlipidemia 01/24/2020  . ST elevation myocardial infarction (STEMI) (HCC) 01/06/2020  . Current use of long term anticoagulation 12/21/2016  . Dyslipidemia 10/01/2015  . Essential hypertension 10/29/2014  . PAF (paroxysmal atrial fibrillation) (HCC) 10/29/2014   Current Outpatient Medications on File Prior to Visit  Medication Sig Dispense Refill  . amiodarone (PACERONE) 200 MG tablet Take by mouth.    . clopidogrel (PLAVIX) 75 MG tablet Take by mouth.    . furosemide (LASIX) 40 MG tablet Take one tablet two times a week--may take additional for increased swelling or shortness of breath    . metoprolol succinate (TOPROL-XL) 50 MG 24 hr tablet Take by mouth.    . sitaGLIPtin (JANUVIA) 100 MG tablet Take 1 tablet by mouth at bedtime.    . Albuterol Sulfate 108 (90 Base) MCG/ACT AEPB Inhale 1-2 puffs into the lungs daily as needed.    Marland Kitchen apixaban (ELIQUIS) 5 MG TABS tablet Take 5 mg by mouth 2 (two) times daily.    Marland Kitchen atorvastatin (LIPITOR) 40 MG tablet Take 40 mg by mouth daily.    . cyanocobalamin 1000 MCG tablet Take by mouth.    . diltiazem (CARDIZEM) 120 MG tablet Take 120 mg by mouth daily.    Marland Kitchen doxycycline (VIBRAMYCIN) 100 MG capsule Take 100 mg by mouth 2 (two) times daily.    Marland Kitchen dronedarone (MULTAQ) 400 MG tablet Take 400 mg  by mouth 2 (two) times daily.    . finasteride (PROSCAR) 5 MG tablet Take 5 mg by mouth daily.    Marland Kitchen gabapentin (NEURONTIN) 100 MG capsule Take 100 mg by mouth daily.    Marland Kitchen glimepiride (AMARYL) 4 MG tablet Take 4 mg by mouth every morning.    Marland Kitchen ipratropium (ATROVENT HFA) 17 MCG/ACT inhaler Inhale into the lungs.    Marland Kitchen ipratropium (ATROVENT) 0.02 % nebulizer solution Take 500 mcg by nebulization 4 (four) times daily as needed for Wheezing.    Marland Kitchen lisinopril-hydrochlorothiazide (PRINZIDE,ZESTORETIC) 20-25 MG tablet Take 1 tablet by mouth daily.    . metFORMIN (GLUCOPHAGE) 1000 MG tablet Take 1,000 mg by mouth 2 (two) times daily.    Letta Pate VERIO test strip SMARTSIG:Via Meter    . PARoxetine (PAXIL) 20 MG tablet Take 20 mg by mouth daily.    . tamsulosin (FLOMAX) 0.4 MG CAPS capsule Take 0.4 mg by mouth daily.    . TRULICITY 1.5 MG/0.5ML SOPN INJECT1 SOLUTION PEN INJECTOR ONCE A WEEK    . Vitamin D, Ergocalciferol, (DRISDOL) 1.25 MG (50000 UNIT) CAPS capsule Take 50,000 Units by mouth once a week.     No current facility-administered medications on file prior to visit.   Allergies  Allergen Reactions  . Morphine     Other reaction(s): Other (See Comments) Angry, violent    No results found for this or any previous visit (  from the past 2160 hour(s)).  Objective: General: Patient is awake, alert, and oriented x 3 and in no acute distress.  Integument: Skin is warm, dry and supple bilateral. Nails are tender, long, thickened and dystrophic with subungual debris, consistent with onychomycosis, 1-5 bilateral. No signs of infection. No open lesions or preulcerative lesions present bilateral. Remaining integument unremarkable.  Vasculature:  Dorsalis Pedis pulse 1/4 bilateral. Posterior Tibial pulse 1/4 bilateral.  Capillary fill time <3 sec 1-5 bilateral. Positive hair growth to the level of the digits. Temperature gradient within normal limits. No varicosities present bilateral. No edema  present bilateral.   Neurology: The patient has intact sensation measured with a 5.07/10g Semmes Weinstein Monofilament at all pedal sites bilateral . Vibratory sensation diminished slightly on the left with tuning fork. No Babinski sign present bilateral.   Musculoskeletal: Asymptomatic pes planus and hammertoe pedal deformities noted bilateral. Muscular strength 5/5 in all lower extremity muscular groups bilateral without pain on range of motion . No tenderness with calf compression bilateral.  Assessment and Plan: Problem List Items Addressed This Visit      Other   Current use of long term anticoagulation    Other Visit Diagnoses    Pain due to onychomycosis of toenails of both feet    -  Primary   Diabetes mellitus without complication (HCC)       Relevant Medications   sitaGLIPtin (JANUVIA) 100 MG tablet      -Examined patient. -Re-Discussed the importance of daily foot inspection in the setting of diabetes  -Mechanically debrided all nails 1-5 bilateral using sterile nail nipper and filed with dremel without incident  -Continue with cardiac follow-up for management of Eliquis -Continue with PCP follow-up for diabetic control/medication management -Answered all patient questions -Patient to return  in 3 months for at risk foot care -Patient advised to call the office if any problems or questions arise in the meantime.  Asencion Islam, DPM

## 2020-06-03 DIAGNOSIS — I480 Paroxysmal atrial fibrillation: Secondary | ICD-10-CM | POA: Diagnosis not present

## 2020-06-04 DIAGNOSIS — I4891 Unspecified atrial fibrillation: Secondary | ICD-10-CM | POA: Diagnosis not present

## 2020-06-04 DIAGNOSIS — I491 Atrial premature depolarization: Secondary | ICD-10-CM | POA: Diagnosis not present

## 2020-06-04 DIAGNOSIS — I471 Supraventricular tachycardia: Secondary | ICD-10-CM | POA: Diagnosis not present

## 2020-07-15 DIAGNOSIS — E785 Hyperlipidemia, unspecified: Secondary | ICD-10-CM | POA: Diagnosis not present

## 2020-07-15 DIAGNOSIS — I480 Paroxysmal atrial fibrillation: Secondary | ICD-10-CM | POA: Diagnosis not present

## 2020-07-15 DIAGNOSIS — Z955 Presence of coronary angioplasty implant and graft: Secondary | ICD-10-CM | POA: Diagnosis not present

## 2020-07-15 DIAGNOSIS — I252 Old myocardial infarction: Secondary | ICD-10-CM | POA: Diagnosis not present

## 2020-07-15 DIAGNOSIS — I2510 Atherosclerotic heart disease of native coronary artery without angina pectoris: Secondary | ICD-10-CM | POA: Diagnosis not present

## 2020-07-15 DIAGNOSIS — I110 Hypertensive heart disease with heart failure: Secondary | ICD-10-CM | POA: Diagnosis not present

## 2020-07-15 DIAGNOSIS — Z7901 Long term (current) use of anticoagulants: Secondary | ICD-10-CM | POA: Diagnosis not present

## 2020-07-24 DIAGNOSIS — I252 Old myocardial infarction: Secondary | ICD-10-CM | POA: Diagnosis not present

## 2020-07-24 DIAGNOSIS — Z7901 Long term (current) use of anticoagulants: Secondary | ICD-10-CM | POA: Diagnosis not present

## 2020-07-24 DIAGNOSIS — I480 Paroxysmal atrial fibrillation: Secondary | ICD-10-CM | POA: Diagnosis not present

## 2020-07-24 DIAGNOSIS — Z955 Presence of coronary angioplasty implant and graft: Secondary | ICD-10-CM | POA: Diagnosis not present

## 2020-07-24 DIAGNOSIS — R0789 Other chest pain: Secondary | ICD-10-CM | POA: Insufficient documentation

## 2020-07-24 DIAGNOSIS — I2510 Atherosclerotic heart disease of native coronary artery without angina pectoris: Secondary | ICD-10-CM | POA: Diagnosis not present

## 2020-07-24 DIAGNOSIS — E785 Hyperlipidemia, unspecified: Secondary | ICD-10-CM | POA: Diagnosis not present

## 2020-07-24 DIAGNOSIS — I10 Essential (primary) hypertension: Secondary | ICD-10-CM | POA: Diagnosis not present

## 2020-08-03 DIAGNOSIS — N401 Enlarged prostate with lower urinary tract symptoms: Secondary | ICD-10-CM | POA: Diagnosis not present

## 2020-08-03 DIAGNOSIS — R972 Elevated prostate specific antigen [PSA]: Secondary | ICD-10-CM | POA: Diagnosis not present

## 2020-08-18 ENCOUNTER — Other Ambulatory Visit: Payer: Self-pay

## 2020-08-18 ENCOUNTER — Ambulatory Visit (INDEPENDENT_AMBULATORY_CARE_PROVIDER_SITE_OTHER): Payer: Medicare HMO | Admitting: Sports Medicine

## 2020-08-18 ENCOUNTER — Encounter: Payer: Self-pay | Admitting: Sports Medicine

## 2020-08-18 DIAGNOSIS — E119 Type 2 diabetes mellitus without complications: Secondary | ICD-10-CM

## 2020-08-18 DIAGNOSIS — M79674 Pain in right toe(s): Secondary | ICD-10-CM

## 2020-08-18 DIAGNOSIS — Z7901 Long term (current) use of anticoagulants: Secondary | ICD-10-CM | POA: Diagnosis not present

## 2020-08-18 DIAGNOSIS — B351 Tinea unguium: Secondary | ICD-10-CM | POA: Diagnosis not present

## 2020-08-18 DIAGNOSIS — M79675 Pain in left toe(s): Secondary | ICD-10-CM | POA: Diagnosis not present

## 2020-08-18 NOTE — Progress Notes (Signed)
Subjective: Samuel Green is a 75 y.o. male patient with history of diabetes who returns to office today complaining of long,mildly painful nails  while ambulating in shoes; unable to trim. Patient states his blood sugars are doing good.  Reports that he is still seeing cardiology and is still on Eliquis like previous.  No other issues noted.  Fasting blood sugar not recorded A1c not recorded PCP visit over 3 months ago  Patient Active Problem List   Diagnosis Date Noted  . H/O heart artery stent 03/02/2020  . Coronary artery disease involving native coronary artery of native heart without angina pectoris 01/24/2020  . Left ventricular dysfunction 01/24/2020  . Other hyperlipidemia 01/24/2020  . ST elevation myocardial infarction (STEMI) (HCC) 01/06/2020  . Current use of long term anticoagulation 12/21/2016  . Dyslipidemia 10/01/2015  . Essential hypertension 10/29/2014  . PAF (paroxysmal atrial fibrillation) (HCC) 10/29/2014   Current Outpatient Medications on File Prior to Visit  Medication Sig Dispense Refill  . Albuterol Sulfate 108 (90 Base) MCG/ACT AEPB Inhale 1-2 puffs into the lungs daily as needed.    Marland Kitchen amiodarone (PACERONE) 200 MG tablet Take by mouth.    Marland Kitchen apixaban (ELIQUIS) 5 MG TABS tablet Take 5 mg by mouth 2 (two) times daily.    Marland Kitchen atorvastatin (LIPITOR) 40 MG tablet Take 40 mg by mouth daily.    Marland Kitchen atorvastatin (LIPITOR) 80 MG tablet SMARTSIG:1 Tablet(s) By Mouth Every Evening    . clopidogrel (PLAVIX) 75 MG tablet Take by mouth.    . cyanocobalamin 1000 MCG tablet Take by mouth.    . diltiazem (CARDIZEM) 120 MG tablet Take 120 mg by mouth daily.    Marland Kitchen doxycycline (VIBRAMYCIN) 100 MG capsule Take 100 mg by mouth 2 (two) times daily.    Marland Kitchen dronedarone (MULTAQ) 400 MG tablet Take 400 mg by mouth 2 (two) times daily.    . finasteride (PROSCAR) 5 MG tablet Take 5 mg by mouth daily.    . furosemide (LASIX) 40 MG tablet Take one tablet two times a week--may take  additional for increased swelling or shortness of breath    . gabapentin (NEURONTIN) 100 MG capsule Take 100 mg by mouth daily.    Marland Kitchen glimepiride (AMARYL) 4 MG tablet Take 4 mg by mouth every morning.    Marland Kitchen ipratropium (ATROVENT HFA) 17 MCG/ACT inhaler Inhale into the lungs.    Marland Kitchen ipratropium (ATROVENT) 0.02 % nebulizer solution Take 500 mcg by nebulization 4 (four) times daily as needed for Wheezing.    Marland Kitchen lisinopril-hydrochlorothiazide (PRINZIDE,ZESTORETIC) 20-25 MG tablet Take 1 tablet by mouth daily.    . metFORMIN (GLUCOPHAGE) 1000 MG tablet Take 1,000 mg by mouth 2 (two) times daily.    . metoprolol succinate (TOPROL-XL) 50 MG 24 hr tablet Take by mouth.    Letta Pate VERIO test strip SMARTSIG:Via Meter    . PARoxetine (PAXIL) 20 MG tablet Take 20 mg by mouth daily.    . sitaGLIPtin (JANUVIA) 100 MG tablet Take 1 tablet by mouth at bedtime.    . tamsulosin (FLOMAX) 0.4 MG CAPS capsule Take 0.4 mg by mouth daily.    . TRULICITY 1.5 MG/0.5ML SOPN INJECT1 SOLUTION PEN INJECTOR ONCE A WEEK    . Vitamin D, Ergocalciferol, (DRISDOL) 1.25 MG (50000 UNIT) CAPS capsule Take 50,000 Units by mouth once a week.     No current facility-administered medications on file prior to visit.   Allergies  Allergen Reactions  . Morphine     Other  reaction(s): Other (See Comments) Angry, violent    No results found for this or any previous visit (from the past 2160 hour(s)).  Objective: General: Patient is awake, alert, and oriented x 3 and in no acute distress.  Integument: Skin is warm, dry and supple bilateral. Nails are tender, long, thickened and dystrophic with subungual debris, consistent with onychomycosis, 1-5 bilateral. No signs of infection. No open lesions or preulcerative lesions present bilateral. Remaining integument unremarkable.  Vasculature:  Dorsalis Pedis pulse 1/4 bilateral. Posterior Tibial pulse 1/4 bilateral.  Capillary fill time <3 sec 1-5 bilateral. Positive hair growth to the  level of the digits. Temperature gradient within normal limits. No varicosities present bilateral. No edema present bilateral.   Neurology: The patient has intact sensation measured with a 5.07/10g Semmes Weinstein Monofilament at all pedal sites bilateral . Vibratory sensation diminished slightly on the left with tuning fork. No Babinski sign present bilateral.   Musculoskeletal: Asymptomatic pes planus and hammertoe pedal deformities noted bilateral. Muscular strength 5/5 in all lower extremity muscular groups bilateral without pain on range of motion . No tenderness with calf compression bilateral.  Assessment and Plan: Problem List Items Addressed This Visit      Other   Current use of long term anticoagulation    Other Visit Diagnoses    Pain due to onychomycosis of toenails of both feet    -  Primary   Diabetes mellitus without complication (HCC)       Relevant Medications   atorvastatin (LIPITOR) 80 MG tablet      -Examined patient. -Re-Discussed the importance of daily foot inspection in the setting of diabetes  -Mechanically debrided all nails 1-5 bilateral using sterile nail nipper and filed with dremel without incident  -Continue with cardiac follow-up for management of Eliquis -Patient to return  in 3 months for at risk foot care -Patient advised to call the office if any problems or questions arise in the meantime.  Asencion Islam, DPM

## 2020-09-08 DIAGNOSIS — I10 Essential (primary) hypertension: Secondary | ICD-10-CM | POA: Diagnosis not present

## 2020-09-08 DIAGNOSIS — J449 Chronic obstructive pulmonary disease, unspecified: Secondary | ICD-10-CM | POA: Diagnosis not present

## 2020-09-08 DIAGNOSIS — E0821 Diabetes mellitus due to underlying condition with diabetic nephropathy: Secondary | ICD-10-CM | POA: Diagnosis not present

## 2020-09-08 DIAGNOSIS — J309 Allergic rhinitis, unspecified: Secondary | ICD-10-CM | POA: Diagnosis not present

## 2020-09-08 DIAGNOSIS — I2510 Atherosclerotic heart disease of native coronary artery without angina pectoris: Secondary | ICD-10-CM | POA: Diagnosis not present

## 2020-09-08 DIAGNOSIS — Z79899 Other long term (current) drug therapy: Secondary | ICD-10-CM | POA: Diagnosis not present

## 2020-09-08 DIAGNOSIS — E785 Hyperlipidemia, unspecified: Secondary | ICD-10-CM | POA: Diagnosis not present

## 2020-09-08 DIAGNOSIS — I498 Other specified cardiac arrhythmias: Secondary | ICD-10-CM | POA: Diagnosis not present

## 2020-09-08 DIAGNOSIS — D519 Vitamin B12 deficiency anemia, unspecified: Secondary | ICD-10-CM | POA: Diagnosis not present

## 2020-09-08 DIAGNOSIS — B351 Tinea unguium: Secondary | ICD-10-CM | POA: Diagnosis not present

## 2020-09-08 DIAGNOSIS — E559 Vitamin D deficiency, unspecified: Secondary | ICD-10-CM | POA: Diagnosis not present

## 2020-09-08 DIAGNOSIS — Z8616 Personal history of COVID-19: Secondary | ICD-10-CM | POA: Diagnosis not present

## 2020-10-23 DIAGNOSIS — M9905 Segmental and somatic dysfunction of pelvic region: Secondary | ICD-10-CM | POA: Diagnosis not present

## 2020-10-23 DIAGNOSIS — I2510 Atherosclerotic heart disease of native coronary artery without angina pectoris: Secondary | ICD-10-CM | POA: Diagnosis not present

## 2020-10-23 DIAGNOSIS — Z7901 Long term (current) use of anticoagulants: Secondary | ICD-10-CM | POA: Diagnosis not present

## 2020-10-23 DIAGNOSIS — M9902 Segmental and somatic dysfunction of thoracic region: Secondary | ICD-10-CM | POA: Diagnosis not present

## 2020-10-23 DIAGNOSIS — I480 Paroxysmal atrial fibrillation: Secondary | ICD-10-CM | POA: Diagnosis not present

## 2020-10-23 DIAGNOSIS — I10 Essential (primary) hypertension: Secondary | ICD-10-CM | POA: Diagnosis not present

## 2020-10-23 DIAGNOSIS — I420 Dilated cardiomyopathy: Secondary | ICD-10-CM | POA: Diagnosis not present

## 2020-10-23 DIAGNOSIS — M9903 Segmental and somatic dysfunction of lumbar region: Secondary | ICD-10-CM | POA: Diagnosis not present

## 2020-10-23 DIAGNOSIS — I252 Old myocardial infarction: Secondary | ICD-10-CM | POA: Diagnosis not present

## 2020-10-23 DIAGNOSIS — Z955 Presence of coronary angioplasty implant and graft: Secondary | ICD-10-CM | POA: Diagnosis not present

## 2020-10-23 DIAGNOSIS — E785 Hyperlipidemia, unspecified: Secondary | ICD-10-CM | POA: Diagnosis not present

## 2020-10-23 DIAGNOSIS — I255 Ischemic cardiomyopathy: Secondary | ICD-10-CM | POA: Diagnosis not present

## 2020-10-23 DIAGNOSIS — M9904 Segmental and somatic dysfunction of sacral region: Secondary | ICD-10-CM | POA: Diagnosis not present

## 2020-11-18 ENCOUNTER — Other Ambulatory Visit: Payer: Self-pay

## 2020-11-18 ENCOUNTER — Encounter: Payer: Self-pay | Admitting: Sports Medicine

## 2020-11-18 ENCOUNTER — Ambulatory Visit (INDEPENDENT_AMBULATORY_CARE_PROVIDER_SITE_OTHER): Payer: Medicare HMO | Admitting: Sports Medicine

## 2020-11-18 DIAGNOSIS — B351 Tinea unguium: Secondary | ICD-10-CM | POA: Diagnosis not present

## 2020-11-18 DIAGNOSIS — Z7901 Long term (current) use of anticoagulants: Secondary | ICD-10-CM

## 2020-11-18 DIAGNOSIS — Z8616 Personal history of COVID-19: Secondary | ICD-10-CM | POA: Diagnosis not present

## 2020-11-18 DIAGNOSIS — E119 Type 2 diabetes mellitus without complications: Secondary | ICD-10-CM | POA: Diagnosis not present

## 2020-11-18 DIAGNOSIS — J449 Chronic obstructive pulmonary disease, unspecified: Secondary | ICD-10-CM | POA: Diagnosis not present

## 2020-11-18 DIAGNOSIS — M79675 Pain in left toe(s): Secondary | ICD-10-CM | POA: Diagnosis not present

## 2020-11-18 DIAGNOSIS — J309 Allergic rhinitis, unspecified: Secondary | ICD-10-CM | POA: Diagnosis not present

## 2020-11-18 DIAGNOSIS — E559 Vitamin D deficiency, unspecified: Secondary | ICD-10-CM | POA: Diagnosis not present

## 2020-11-18 DIAGNOSIS — I498 Other specified cardiac arrhythmias: Secondary | ICD-10-CM | POA: Diagnosis not present

## 2020-11-18 DIAGNOSIS — M79674 Pain in right toe(s): Secondary | ICD-10-CM

## 2020-11-18 DIAGNOSIS — I2510 Atherosclerotic heart disease of native coronary artery without angina pectoris: Secondary | ICD-10-CM | POA: Diagnosis not present

## 2020-11-18 DIAGNOSIS — E785 Hyperlipidemia, unspecified: Secondary | ICD-10-CM | POA: Diagnosis not present

## 2020-11-18 DIAGNOSIS — E0821 Diabetes mellitus due to underlying condition with diabetic nephropathy: Secondary | ICD-10-CM | POA: Diagnosis not present

## 2020-11-18 DIAGNOSIS — I10 Essential (primary) hypertension: Secondary | ICD-10-CM | POA: Diagnosis not present

## 2020-11-18 NOTE — Progress Notes (Signed)
Subjective: Samuel Green is a 75 y.o. male patient with history of diabetes who returns to office today complaining of long,mildly painful nails  while ambulating in shoes; unable to trim. Patient states his blood sugars are doing good A1c as below and still on Eliquis like before.  No other issues noted.  Fasting blood sugar not recorded A1c 7.1 PCP visit, Dr. Mathis Bud over 3 months ago  Patient Active Problem List   Diagnosis Date Noted   H/O heart artery stent 03/02/2020   Coronary artery disease involving native coronary artery of native heart without angina pectoris 01/24/2020   Left ventricular dysfunction 01/24/2020   Other hyperlipidemia 01/24/2020   ST elevation myocardial infarction (STEMI) (HCC) 01/06/2020   Current use of long term anticoagulation 12/21/2016   Dyslipidemia 10/01/2015   Essential hypertension 10/29/2014   PAF (paroxysmal atrial fibrillation) (HCC) 10/29/2014   Current Outpatient Medications on File Prior to Visit  Medication Sig Dispense Refill   Albuterol Sulfate 108 (90 Base) MCG/ACT AEPB Inhale 1-2 puffs into the lungs daily as needed.     amiodarone (PACERONE) 200 MG tablet Take by mouth.     apixaban (ELIQUIS) 5 MG TABS tablet Take 5 mg by mouth 2 (two) times daily.     atorvastatin (LIPITOR) 40 MG tablet Take 40 mg by mouth daily.     atorvastatin (LIPITOR) 80 MG tablet SMARTSIG:1 Tablet(s) By Mouth Every Evening     clopidogrel (PLAVIX) 75 MG tablet Take by mouth.     cyanocobalamin 1000 MCG tablet Take by mouth.     diltiazem (CARDIZEM) 120 MG tablet Take 120 mg by mouth daily.     doxycycline (VIBRAMYCIN) 100 MG capsule Take 100 mg by mouth 2 (two) times daily.     dronedarone (MULTAQ) 400 MG tablet Take 400 mg by mouth 2 (two) times daily.     finasteride (PROSCAR) 5 MG tablet Take 5 mg by mouth daily.     furosemide (LASIX) 40 MG tablet Take one tablet two times a week--may take additional for increased swelling or shortness of breath      gabapentin (NEURONTIN) 100 MG capsule Take 100 mg by mouth daily.     glimepiride (AMARYL) 4 MG tablet Take 4 mg by mouth every morning.     ipratropium (ATROVENT HFA) 17 MCG/ACT inhaler Inhale into the lungs.     ipratropium (ATROVENT) 0.02 % nebulizer solution Take 500 mcg by nebulization 4 (four) times daily as needed for Wheezing.     lisinopril-hydrochlorothiazide (PRINZIDE,ZESTORETIC) 20-25 MG tablet Take 1 tablet by mouth daily.     metFORMIN (GLUCOPHAGE) 1000 MG tablet Take 1,000 mg by mouth 2 (two) times daily.     metoprolol succinate (TOPROL-XL) 50 MG 24 hr tablet Take by mouth.     ONETOUCH VERIO test strip SMARTSIG:Via Meter     PARoxetine (PAXIL) 20 MG tablet Take 20 mg by mouth daily.     sitaGLIPtin (JANUVIA) 100 MG tablet Take 1 tablet by mouth at bedtime.     tamsulosin (FLOMAX) 0.4 MG CAPS capsule Take 0.4 mg by mouth daily.     TRULICITY 1.5 MG/0.5ML SOPN INJECT1 SOLUTION PEN INJECTOR ONCE A WEEK     Vitamin D, Ergocalciferol, (DRISDOL) 1.25 MG (50000 UNIT) CAPS capsule Take 50,000 Units by mouth once a week.     No current facility-administered medications on file prior to visit.   Allergies  Allergen Reactions   Morphine     Other reaction(s): Other (See Comments) Angry,  violent    No results found for this or any previous visit (from the past 2160 hour(s)).  Objective: General: Patient is awake, alert, and oriented x 3 and in no acute distress.  Integument: Skin is warm, dry and supple bilateral. Nails are tender, long, thickened and dystrophic with subungual debris, consistent with onychomycosis, 1-5 bilateral. No signs of infection. No open lesions or preulcerative lesions present bilateral. Remaining integument unremarkable.  Vasculature:  Dorsalis Pedis pulse 1/4 bilateral. Posterior Tibial pulse 1/4 bilateral.  Capillary fill time <3 sec 1-5 bilateral. Positive hair growth to the level of the digits. Temperature gradient within normal limits. No  varicosities present bilateral. No edema present bilateral.   Neurology: The patient has intact sensation measured with a 5.07/10g Semmes Weinstein Monofilament at all pedal sites bilateral . Vibratory sensation diminished slightly on the left with tuning fork. No Babinski sign present bilateral.   Musculoskeletal: Asymptomatic pes planus and hammertoe pedal deformities noted bilateral. Muscular strength 5/5 in all lower extremity muscular groups bilateral without pain on range of motion . No tenderness with calf compression bilateral.  Assessment and Plan: Problem List Items Addressed This Visit       Other   Current use of long term anticoagulation   Other Visit Diagnoses     Pain due to onychomycosis of toenails of both feet    -  Primary   Diabetes mellitus without complication (HCC)           -Examined patient. -Re-Discussed the importance of daily foot inspection in the setting of diabetes  -Mechanically debrided all nails 1-5 bilateral using sterile nail nipper and filed with dremel without incident  -Continue with cardiac follow-up for management of Eliquis like before -Patient to return  in 3 months for at risk foot care -Patient advised to call the office if any problems or questions arise in the meantime.  Asencion Islam, DPM

## 2020-12-02 DIAGNOSIS — R338 Other retention of urine: Secondary | ICD-10-CM | POA: Diagnosis not present

## 2020-12-02 DIAGNOSIS — N401 Enlarged prostate with lower urinary tract symptoms: Secondary | ICD-10-CM | POA: Diagnosis not present

## 2020-12-09 DIAGNOSIS — R338 Other retention of urine: Secondary | ICD-10-CM | POA: Diagnosis not present

## 2020-12-09 DIAGNOSIS — N401 Enlarged prostate with lower urinary tract symptoms: Secondary | ICD-10-CM | POA: Diagnosis not present

## 2021-01-18 DIAGNOSIS — N401 Enlarged prostate with lower urinary tract symptoms: Secondary | ICD-10-CM | POA: Diagnosis not present

## 2021-01-18 DIAGNOSIS — R338 Other retention of urine: Secondary | ICD-10-CM | POA: Diagnosis not present

## 2021-02-01 DIAGNOSIS — J449 Chronic obstructive pulmonary disease, unspecified: Secondary | ICD-10-CM | POA: Diagnosis not present

## 2021-02-01 DIAGNOSIS — R051 Acute cough: Secondary | ICD-10-CM | POA: Diagnosis not present

## 2021-02-01 DIAGNOSIS — Z20828 Contact with and (suspected) exposure to other viral communicable diseases: Secondary | ICD-10-CM | POA: Diagnosis not present

## 2021-02-01 DIAGNOSIS — E119 Type 2 diabetes mellitus without complications: Secondary | ICD-10-CM | POA: Diagnosis not present

## 2021-02-01 DIAGNOSIS — R509 Fever, unspecified: Secondary | ICD-10-CM | POA: Diagnosis not present

## 2021-02-10 DIAGNOSIS — I480 Paroxysmal atrial fibrillation: Secondary | ICD-10-CM | POA: Diagnosis not present

## 2021-02-10 DIAGNOSIS — I252 Old myocardial infarction: Secondary | ICD-10-CM | POA: Diagnosis not present

## 2021-02-10 DIAGNOSIS — I255 Ischemic cardiomyopathy: Secondary | ICD-10-CM | POA: Diagnosis not present

## 2021-02-10 DIAGNOSIS — Z7901 Long term (current) use of anticoagulants: Secondary | ICD-10-CM | POA: Diagnosis not present

## 2021-02-10 DIAGNOSIS — I10 Essential (primary) hypertension: Secondary | ICD-10-CM | POA: Diagnosis not present

## 2021-02-10 DIAGNOSIS — I420 Dilated cardiomyopathy: Secondary | ICD-10-CM | POA: Diagnosis not present

## 2021-02-10 DIAGNOSIS — I2510 Atherosclerotic heart disease of native coronary artery without angina pectoris: Secondary | ICD-10-CM | POA: Diagnosis not present

## 2021-02-10 DIAGNOSIS — R0789 Other chest pain: Secondary | ICD-10-CM | POA: Diagnosis not present

## 2021-02-10 DIAGNOSIS — Z955 Presence of coronary angioplasty implant and graft: Secondary | ICD-10-CM | POA: Diagnosis not present

## 2021-02-17 ENCOUNTER — Encounter: Payer: Self-pay | Admitting: Sports Medicine

## 2021-02-17 ENCOUNTER — Ambulatory Visit (INDEPENDENT_AMBULATORY_CARE_PROVIDER_SITE_OTHER): Payer: Medicare HMO | Admitting: Sports Medicine

## 2021-02-17 ENCOUNTER — Other Ambulatory Visit: Payer: Self-pay

## 2021-02-17 DIAGNOSIS — M79675 Pain in left toe(s): Secondary | ICD-10-CM | POA: Diagnosis not present

## 2021-02-17 DIAGNOSIS — M79674 Pain in right toe(s): Secondary | ICD-10-CM | POA: Diagnosis not present

## 2021-02-17 DIAGNOSIS — Z7901 Long term (current) use of anticoagulants: Secondary | ICD-10-CM

## 2021-02-17 DIAGNOSIS — E119 Type 2 diabetes mellitus without complications: Secondary | ICD-10-CM

## 2021-02-17 DIAGNOSIS — B351 Tinea unguium: Secondary | ICD-10-CM | POA: Diagnosis not present

## 2021-02-17 NOTE — Progress Notes (Signed)
Subjective: Samuel Green is a 75 y.o. male patient with history of diabetes who returns to office today complaining of long,mildly painful nails  while ambulating in shoes; unable to trim.  Denies any changes with medications since last visit still takes Eliquis.  Fasting blood sugar not recorded today but 2 days ago was 120 A1c 7.1 PCP visit, Dr. Mathis Bud over 1 month ago  Patient Active Problem List   Diagnosis Date Noted   H/O heart artery stent 03/02/2020   Coronary artery disease involving native coronary artery of native heart without angina pectoris 01/24/2020   Left ventricular dysfunction 01/24/2020   Other hyperlipidemia 01/24/2020   ST elevation myocardial infarction (STEMI) (HCC) 01/06/2020   Current use of long term anticoagulation 12/21/2016   Dyslipidemia 10/01/2015   Essential hypertension 10/29/2014   PAF (paroxysmal atrial fibrillation) (HCC) 10/29/2014   Current Outpatient Medications on File Prior to Visit  Medication Sig Dispense Refill   atorvastatin (LIPITOR) 80 MG tablet Take by mouth.     Albuterol Sulfate 108 (90 Base) MCG/ACT AEPB Inhale 1-2 puffs into the lungs daily as needed.     amiodarone (PACERONE) 200 MG tablet Take by mouth.     apixaban (ELIQUIS) 5 MG TABS tablet Take 5 mg by mouth 2 (two) times daily.     atorvastatin (LIPITOR) 40 MG tablet Take 40 mg by mouth daily.     atorvastatin (LIPITOR) 80 MG tablet SMARTSIG:1 Tablet(s) By Mouth Every Evening     cyanocobalamin 1000 MCG tablet Take by mouth.     diltiazem (CARDIZEM) 120 MG tablet Take 120 mg by mouth daily.     doxycycline (VIBRAMYCIN) 100 MG capsule Take 100 mg by mouth 2 (two) times daily.     dronedarone (MULTAQ) 400 MG tablet Take 400 mg by mouth 2 (two) times daily.     finasteride (PROSCAR) 5 MG tablet Take 5 mg by mouth daily.     furosemide (LASIX) 40 MG tablet Take one tablet two times a week--may take additional for increased swelling or shortness of breath     gabapentin  (NEURONTIN) 100 MG capsule Take 100 mg by mouth daily.     glimepiride (AMARYL) 4 MG tablet Take 4 mg by mouth every morning.     ipratropium (ATROVENT HFA) 17 MCG/ACT inhaler Inhale into the lungs.     ipratropium (ATROVENT) 0.02 % nebulizer solution Take 500 mcg by nebulization 4 (four) times daily as needed for Wheezing.     lisinopril-hydrochlorothiazide (PRINZIDE,ZESTORETIC) 20-25 MG tablet Take 1 tablet by mouth daily.     metFORMIN (GLUCOPHAGE) 1000 MG tablet Take 1,000 mg by mouth 2 (two) times daily.     ONETOUCH VERIO test strip SMARTSIG:Via Meter     PARoxetine (PAXIL) 20 MG tablet Take 20 mg by mouth daily.     sitaGLIPtin (JANUVIA) 100 MG tablet Take 1 tablet by mouth at bedtime.     tamsulosin (FLOMAX) 0.4 MG CAPS capsule Take 0.4 mg by mouth daily.     TRULICITY 1.5 MG/0.5ML SOPN INJECT1 SOLUTION PEN INJECTOR ONCE A WEEK     Vitamin D, Ergocalciferol, (DRISDOL) 1.25 MG (50000 UNIT) CAPS capsule Take 50,000 Units by mouth once a week.     No current facility-administered medications on file prior to visit.   Allergies  Allergen Reactions   Morphine     Other reaction(s): Other (See Comments) Angry, violent    No results found for this or any previous visit (from the past 2160 hour(s)).  Objective: General: Patient is awake, alert, and oriented x 3 and in no acute distress.  Integument: Skin is warm, dry and supple bilateral. Nails are tender, long, thickened and dystrophic with subungual debris, consistent with onychomycosis, 1-5 bilateral. No signs of infection. No open lesions or preulcerative lesions present bilateral. Remaining integument unremarkable.  Vasculature:  Dorsalis Pedis pulse 1/4 bilateral. Posterior Tibial pulse 1/4 bilateral.  Capillary fill time <3 sec 1-5 bilateral. Positive hair growth to the level of the digits. Temperature gradient within normal limits. No varicosities present bilateral. No edema present bilateral.   Neurology: The patient has  intact sensation measured with a 5.07/10g Semmes Weinstein Monofilament at all pedal sites bilateral . Vibratory sensation diminished slightly on the left with tuning fork. No Babinski sign present bilateral.   Musculoskeletal: Asymptomatic pes planus and hammertoe pedal deformities noted bilateral. Muscular strength 5/5 in all lower extremity muscular groups bilateral without pain on range of motion . No tenderness with calf compression bilateral.  Assessment and Plan: Problem List Items Addressed This Visit       Other   Current use of long term anticoagulation   Other Visit Diagnoses     Pain due to onychomycosis of toenails of both feet    -  Primary   Diabetes mellitus without complication (HCC)       Relevant Medications   atorvastatin (LIPITOR) 80 MG tablet       -Examined patient. -Re-Discussed the importance of daily foot inspection in the setting of diabetes  -Mechanically debrided all nails 1-5 bilateral using sterile nail nipper in length and girth and filed with dremel without incident  -Continue with cardiac follow-up for management of Eliquis like before -Patient to return  in 3 months for at risk foot care -Patient advised to call the office if any problems or questions arise in the meantime.  Asencion Islam, DPM

## 2021-02-23 DIAGNOSIS — Z7901 Long term (current) use of anticoagulants: Secondary | ICD-10-CM | POA: Diagnosis not present

## 2021-02-23 DIAGNOSIS — I480 Paroxysmal atrial fibrillation: Secondary | ICD-10-CM | POA: Diagnosis not present

## 2021-05-05 DIAGNOSIS — Z139 Encounter for screening, unspecified: Secondary | ICD-10-CM | POA: Diagnosis not present

## 2021-05-05 DIAGNOSIS — Z0000 Encounter for general adult medical examination without abnormal findings: Secondary | ICD-10-CM | POA: Diagnosis not present

## 2021-05-05 DIAGNOSIS — Z1331 Encounter for screening for depression: Secondary | ICD-10-CM | POA: Diagnosis not present

## 2021-05-05 DIAGNOSIS — E785 Hyperlipidemia, unspecified: Secondary | ICD-10-CM | POA: Diagnosis not present

## 2021-05-05 DIAGNOSIS — Z9181 History of falling: Secondary | ICD-10-CM | POA: Diagnosis not present

## 2021-05-21 ENCOUNTER — Ambulatory Visit (INDEPENDENT_AMBULATORY_CARE_PROVIDER_SITE_OTHER): Payer: Medicare HMO | Admitting: Sports Medicine

## 2021-05-21 ENCOUNTER — Encounter: Payer: Self-pay | Admitting: Sports Medicine

## 2021-05-21 DIAGNOSIS — E119 Type 2 diabetes mellitus without complications: Secondary | ICD-10-CM

## 2021-05-21 DIAGNOSIS — M79674 Pain in right toe(s): Secondary | ICD-10-CM

## 2021-05-21 DIAGNOSIS — B351 Tinea unguium: Secondary | ICD-10-CM

## 2021-05-21 DIAGNOSIS — M79675 Pain in left toe(s): Secondary | ICD-10-CM

## 2021-05-21 DIAGNOSIS — Z7901 Long term (current) use of anticoagulants: Secondary | ICD-10-CM

## 2021-05-21 NOTE — Progress Notes (Signed)
Subjective: Samuel Green is a 76 y.o. male patient with history of diabetes who returns to office today complaining of long,mildly painful nails  while ambulating in shoes; unable to trim.  Denies any changes with medications since last visit still takes Eliquis as previously noted.  Fasting blood sugar not recorded  A1c 6.9 PCP visit, Dr. Mathis Bud 2 months  Patient Active Problem List   Diagnosis Date Noted   Chest discomfort 07/24/2020   H/O heart artery stent 03/02/2020   Coronary artery disease involving native coronary artery of native heart without angina pectoris 01/24/2020   Left ventricular dysfunction 01/24/2020   Other hyperlipidemia 01/24/2020   ST elevation myocardial infarction (STEMI) (HCC) 01/06/2020   Current use of long term anticoagulation 12/21/2016   Dyslipidemia 10/01/2015   Essential hypertension 10/29/2014   PAF (paroxysmal atrial fibrillation) (HCC) 10/29/2014   Current Outpatient Medications on File Prior to Visit  Medication Sig Dispense Refill   amiodarone (PACERONE) 200 MG tablet Take 1 tablet by mouth 2 (two) times daily.     Albuterol Sulfate 108 (90 Base) MCG/ACT AEPB Inhale 1-2 puffs into the lungs daily as needed.     amiodarone (PACERONE) 200 MG tablet Take by mouth.     apixaban (ELIQUIS) 5 MG TABS tablet Take 5 mg by mouth 2 (two) times daily.     atorvastatin (LIPITOR) 40 MG tablet Take 40 mg by mouth daily.     atorvastatin (LIPITOR) 80 MG tablet SMARTSIG:1 Tablet(s) By Mouth Every Evening     atorvastatin (LIPITOR) 80 MG tablet Take by mouth.     bethanechol (URECHOLINE) 25 MG tablet Take 25 mg by mouth 2 (two) times daily.     cyanocobalamin 1000 MCG tablet Take by mouth.     diltiazem (CARDIZEM) 120 MG tablet Take 120 mg by mouth daily.     doxycycline (VIBRAMYCIN) 100 MG capsule Take 100 mg by mouth 2 (two) times daily.     dronedarone (MULTAQ) 400 MG tablet Take 400 mg by mouth 2 (two) times daily.     finasteride (PROSCAR) 5 MG  tablet Take 5 mg by mouth daily.     furosemide (LASIX) 40 MG tablet Take one tablet two times a week--may take additional for increased swelling or shortness of breath     gabapentin (NEURONTIN) 100 MG capsule Take 100 mg by mouth daily.     glimepiride (AMARYL) 4 MG tablet Take 4 mg by mouth every morning.     ipratropium (ATROVENT HFA) 17 MCG/ACT inhaler Inhale into the lungs.     ipratropium (ATROVENT) 0.02 % nebulizer solution Take 500 mcg by nebulization 4 (four) times daily as needed for Wheezing.     lisinopril-hydrochlorothiazide (PRINZIDE,ZESTORETIC) 20-25 MG tablet Take 1 tablet by mouth daily.     metFORMIN (GLUCOPHAGE) 1000 MG tablet Take 1,000 mg by mouth 2 (two) times daily.     metoprolol succinate (TOPROL-XL) 50 MG 24 hr tablet Take 50 mg by mouth daily.     ONETOUCH VERIO test strip SMARTSIG:Via Meter     PARoxetine (PAXIL) 20 MG tablet Take 20 mg by mouth daily.     sitaGLIPtin (JANUVIA) 100 MG tablet Take 1 tablet by mouth at bedtime.     tamsulosin (FLOMAX) 0.4 MG CAPS capsule Take 0.4 mg by mouth daily.     TRULICITY 1.5 MG/0.5ML SOPN INJECT1 SOLUTION PEN INJECTOR ONCE A WEEK     Vitamin D, Ergocalciferol, (DRISDOL) 1.25 MG (50000 UNIT) CAPS capsule Take 50,000 Units by  mouth once a week.     No current facility-administered medications on file prior to visit.   Allergies  Allergen Reactions   Morphine     Other reaction(s): Other (See Comments) Angry, violent    No results found for this or any previous visit (from the past 2160 hour(s)).  Objective: General: Patient is awake, alert, and oriented x 3 and in no acute distress.  Integument: Skin is warm, dry and supple bilateral. Nails are tender, long, thickened and dystrophic with subungual debris, consistent with onychomycosis, 1-5 bilateral. No signs of infection. No open lesions or preulcerative lesions present bilateral. Remaining integument unremarkable.  Neurovascular status: Unchanged from  prior  Musculoskeletal: Asymptomatic pes planus and hammertoe pedal deformities noted bilateral. Muscular strength 5/5 in all lower extremity muscular groups bilateral without pain on range of motion . No tenderness with calf compression bilateral.  Assessment and Plan: Problem List Items Addressed This Visit       Other   Current use of long term anticoagulation   Other Visit Diagnoses     Pain due to onychomycosis of toenails of both feet    -  Primary   Diabetes mellitus without complication (HCC)           -Examined patient. -Discussed the importance of diabetic foot care -Mechanically debrided all painful nails 1-5 bilateral using sterile nail nipper in length and girth and filed with dremel without incident  -Return in 3 months for at risk foot care -Patient advised to call the office if any problems or questions arise in the meantime.  Asencion Islam, DPM

## 2021-05-24 DIAGNOSIS — N401 Enlarged prostate with lower urinary tract symptoms: Secondary | ICD-10-CM | POA: Diagnosis not present

## 2021-05-24 DIAGNOSIS — R338 Other retention of urine: Secondary | ICD-10-CM | POA: Diagnosis not present

## 2021-05-24 DIAGNOSIS — R972 Elevated prostate specific antigen [PSA]: Secondary | ICD-10-CM | POA: Diagnosis not present

## 2021-05-27 DIAGNOSIS — E559 Vitamin D deficiency, unspecified: Secondary | ICD-10-CM | POA: Diagnosis not present

## 2021-05-27 DIAGNOSIS — I498 Other specified cardiac arrhythmias: Secondary | ICD-10-CM | POA: Diagnosis not present

## 2021-05-27 DIAGNOSIS — I10 Essential (primary) hypertension: Secondary | ICD-10-CM | POA: Diagnosis not present

## 2021-05-27 DIAGNOSIS — E785 Hyperlipidemia, unspecified: Secondary | ICD-10-CM | POA: Diagnosis not present

## 2021-05-27 DIAGNOSIS — M436 Torticollis: Secondary | ICD-10-CM | POA: Diagnosis not present

## 2021-05-27 DIAGNOSIS — J449 Chronic obstructive pulmonary disease, unspecified: Secondary | ICD-10-CM | POA: Diagnosis not present

## 2021-05-27 DIAGNOSIS — E0821 Diabetes mellitus due to underlying condition with diabetic nephropathy: Secondary | ICD-10-CM | POA: Diagnosis not present

## 2021-05-27 DIAGNOSIS — Z23 Encounter for immunization: Secondary | ICD-10-CM | POA: Diagnosis not present

## 2021-05-27 DIAGNOSIS — I2510 Atherosclerotic heart disease of native coronary artery without angina pectoris: Secondary | ICD-10-CM | POA: Diagnosis not present

## 2021-05-27 DIAGNOSIS — Z79899 Other long term (current) drug therapy: Secondary | ICD-10-CM | POA: Diagnosis not present

## 2021-05-27 DIAGNOSIS — D519 Vitamin B12 deficiency anemia, unspecified: Secondary | ICD-10-CM | POA: Diagnosis not present

## 2021-05-27 DIAGNOSIS — J309 Allergic rhinitis, unspecified: Secondary | ICD-10-CM | POA: Diagnosis not present

## 2021-05-28 DIAGNOSIS — I480 Paroxysmal atrial fibrillation: Secondary | ICD-10-CM | POA: Diagnosis not present

## 2021-05-29 DIAGNOSIS — I440 Atrioventricular block, first degree: Secondary | ICD-10-CM | POA: Diagnosis not present

## 2021-05-31 DIAGNOSIS — I420 Dilated cardiomyopathy: Secondary | ICD-10-CM | POA: Diagnosis not present

## 2021-05-31 DIAGNOSIS — Z7901 Long term (current) use of anticoagulants: Secondary | ICD-10-CM | POA: Diagnosis not present

## 2021-05-31 DIAGNOSIS — Z955 Presence of coronary angioplasty implant and graft: Secondary | ICD-10-CM | POA: Diagnosis not present

## 2021-05-31 DIAGNOSIS — E785 Hyperlipidemia, unspecified: Secondary | ICD-10-CM | POA: Diagnosis not present

## 2021-05-31 DIAGNOSIS — I480 Paroxysmal atrial fibrillation: Secondary | ICD-10-CM | POA: Diagnosis not present

## 2021-05-31 DIAGNOSIS — I10 Essential (primary) hypertension: Secondary | ICD-10-CM | POA: Diagnosis not present

## 2021-05-31 DIAGNOSIS — I252 Old myocardial infarction: Secondary | ICD-10-CM | POA: Diagnosis not present

## 2021-05-31 DIAGNOSIS — I255 Ischemic cardiomyopathy: Secondary | ICD-10-CM | POA: Diagnosis not present

## 2021-05-31 DIAGNOSIS — I2510 Atherosclerotic heart disease of native coronary artery without angina pectoris: Secondary | ICD-10-CM | POA: Diagnosis not present

## 2021-06-10 DIAGNOSIS — I2510 Atherosclerotic heart disease of native coronary artery without angina pectoris: Secondary | ICD-10-CM | POA: Diagnosis not present

## 2021-06-10 DIAGNOSIS — I252 Old myocardial infarction: Secondary | ICD-10-CM | POA: Diagnosis not present

## 2021-07-26 DIAGNOSIS — J309 Allergic rhinitis, unspecified: Secondary | ICD-10-CM | POA: Diagnosis not present

## 2021-07-26 DIAGNOSIS — D519 Vitamin B12 deficiency anemia, unspecified: Secondary | ICD-10-CM | POA: Diagnosis not present

## 2021-07-26 DIAGNOSIS — E785 Hyperlipidemia, unspecified: Secondary | ICD-10-CM | POA: Diagnosis not present

## 2021-07-26 DIAGNOSIS — E559 Vitamin D deficiency, unspecified: Secondary | ICD-10-CM | POA: Diagnosis not present

## 2021-07-26 DIAGNOSIS — J449 Chronic obstructive pulmonary disease, unspecified: Secondary | ICD-10-CM | POA: Diagnosis not present

## 2021-07-26 DIAGNOSIS — N400 Enlarged prostate without lower urinary tract symptoms: Secondary | ICD-10-CM | POA: Diagnosis not present

## 2021-07-26 DIAGNOSIS — E0821 Diabetes mellitus due to underlying condition with diabetic nephropathy: Secondary | ICD-10-CM | POA: Diagnosis not present

## 2021-07-26 DIAGNOSIS — I10 Essential (primary) hypertension: Secondary | ICD-10-CM | POA: Diagnosis not present

## 2021-07-26 DIAGNOSIS — I498 Other specified cardiac arrhythmias: Secondary | ICD-10-CM | POA: Diagnosis not present

## 2021-07-26 DIAGNOSIS — I2510 Atherosclerotic heart disease of native coronary artery without angina pectoris: Secondary | ICD-10-CM | POA: Diagnosis not present

## 2021-08-18 ENCOUNTER — Encounter: Payer: Self-pay | Admitting: Sports Medicine

## 2021-08-18 ENCOUNTER — Ambulatory Visit (INDEPENDENT_AMBULATORY_CARE_PROVIDER_SITE_OTHER): Payer: Medicare HMO | Admitting: Sports Medicine

## 2021-08-18 DIAGNOSIS — M79675 Pain in left toe(s): Secondary | ICD-10-CM

## 2021-08-18 DIAGNOSIS — M79674 Pain in right toe(s): Secondary | ICD-10-CM

## 2021-08-18 DIAGNOSIS — E119 Type 2 diabetes mellitus without complications: Secondary | ICD-10-CM

## 2021-08-18 DIAGNOSIS — B351 Tinea unguium: Secondary | ICD-10-CM | POA: Diagnosis not present

## 2021-08-18 DIAGNOSIS — Z7901 Long term (current) use of anticoagulants: Secondary | ICD-10-CM

## 2021-08-18 NOTE — Progress Notes (Signed)
Subjective: ?Samuel Green is a 76 y.o. male patient with history of diabetes who returns to office today complaining of long,mildly painful nails  while ambulating in shoes; unable to trim.  Denies any changes with medications or health history since last visit still takes Eliquis as previously noted. ? ?Fasting blood sugar not recorded  ?A1c 6.9 thinks it is the same from before ?PCP visit, Dr. Rica Records 2 to 2-1/2 months ? ?Patient Active Problem List  ? Diagnosis Date Noted  ? Chest discomfort 07/24/2020  ? H/O heart artery stent 03/02/2020  ? Coronary artery disease involving native coronary artery of native heart without angina pectoris 01/24/2020  ? Left ventricular dysfunction 01/24/2020  ? Other hyperlipidemia 01/24/2020  ? ST elevation myocardial infarction (STEMI) ([place]) 01/06/2020  ? Current use of long term anticoagulation 12/21/2016  ? Dyslipidemia 10/01/2015  ? Essential hypertension 10/29/2014  ? PAF (paroxysmal atrial fibrillation) ([place]) 10/29/2014  ? ?Current Outpatient Medications on File Prior to Visit  ?Medication Sig Dispense Refill  ? Albuterol Sulfate 108 (90 Base) MCG/ACT AEPB Inhale 1-2 puffs into the lungs daily as needed.    ? amiodarone (PACERONE) 200 MG tablet Take 1 tablet by mouth 2 (two) times daily.    ? apixaban (ELIQUIS) 5 MG TABS tablet Take 5 mg by mouth 2 (two) times daily.    ? aspirin 81 MG EC tablet Take 1 tablet by mouth daily.    ? atorvastatin (LIPITOR) 40 MG tablet Take 40 mg by mouth daily.    ? atorvastatin (LIPITOR) 80 MG tablet Take by mouth.    ? bethanechol (URECHOLINE) 25 MG tablet Take 25 mg by mouth 2 (two) times daily.    ? cyanocobalamin 1000 MCG tablet Take by mouth.    ? diltiazem (CARDIZEM) 120 MG tablet Take 120 mg by mouth daily.    ? dronedarone (MULTAQ) 400 MG tablet Take 400 mg by mouth 2 (two) times daily.    ? finasteride (PROSCAR) 5 MG tablet Take by mouth.    ? furosemide (LASIX) 40 MG tablet Take one tablet two times a week--may take additional  for increased swelling or shortness of breath    ? gabapentin (NEURONTIN) 100 MG capsule Take 100 mg by mouth daily.    ? glimepiride (AMARYL) 4 MG tablet Take 4 mg by mouth every morning.    ? ipratropium (ATROVENT) 0.02 % nebulizer solution Take 500 mcg by nebulization 4 (four) times daily as needed for Wheezing.    ? lisinopril-hydrochlorothiazide (PRINZIDE,ZESTORETIC) 20-25 MG tablet Take 1 tablet by mouth daily.    ? metFORMIN (GLUCOPHAGE) 1000 MG tablet Take by mouth.    ? metoprolol succinate (TOPROL-XL) 50 MG 24 hr tablet Take 50 mg by mouth daily.    ? ONETOUCH VERIO test strip SMARTSIG:Via Meter    ? PARoxetine (PAXIL) 20 MG tablet Take 20 mg by mouth daily.    ? sitaGLIPtin (JANUVIA) 100 MG tablet Take 1 tablet by mouth at bedtime.    ? tamsulosin (FLOMAX) 0.4 MG CAPS capsule Take 0.4 mg by mouth daily.    ? TRULICITY 1.5 0000000 SOPN INJECT1 SOLUTION PEN INJECTOR ONCE A WEEK    ? Vitamin D, Ergocalciferol, (DRISDOL) 1.25 MG (50000 UNIT) CAPS capsule Take 50,000 Units by mouth once a week.    ? ?No current facility-administered medications on file prior to visit.  ? ?Allergies  ?Allergen Reactions  ? Morphine   ?  Other reaction(s): Other (See Comments) ?Angry, violent  ? ? ?No  results found for this or any previous visit (from the past 2160 hour(s)). ? ?Objective: ?General: Patient is awake, alert, and oriented x 3 and in no acute distress. ? ?Integument: Skin is warm, dry and supple bilateral. Nails are tender, long, thickened and dystrophic with subungual debris, consistent with onychomycosis, 1-5 bilateral. No signs of infection. No open lesions or preulcerative lesions present bilateral. Remaining integument unremarkable. ? ?Neurovascular status: Unchanged from prior ? ?Musculoskeletal: Asymptomatic pes planus and hammertoe pedal deformities noted bilateral. Muscular strength 5/5 in all lower extremity muscular groups bilateral without pain on range of motion . No tenderness with calf compression  bilateral. ? ?Assessment and Plan: ?Problem List Items Addressed This Visit   ? ?  ? Other  ? Current use of long term anticoagulation  ? ?Other Visit Diagnoses   ? ? Pain due to onychomycosis of toenails of both feet    -  Primary  ? Diabetes mellitus without complication ([place])      ? Relevant Medications  ? aspirin 81 MG EC tablet  ? metFORMIN (GLUCOPHAGE) 1000 MG tablet  ? ?  ? ? ?-Examined patient. ?-Discussed the importance of diabetic foot care and daily foot inspection ?-Mechanically debrided all painful thicken nails 1-5 bilateral using sterile nail nipper in length and girth and filed with dremel without incident  ?-Return in 3 months for at risk foot care ?-Patient advised to call the office if any problems or questions arise in the meantime. ? ?Landis Martins, DPM ?

## 2021-09-21 DIAGNOSIS — R509 Fever, unspecified: Secondary | ICD-10-CM | POA: Diagnosis not present

## 2021-09-21 DIAGNOSIS — J209 Acute bronchitis, unspecified: Secondary | ICD-10-CM | POA: Diagnosis not present

## 2021-09-21 DIAGNOSIS — R059 Cough, unspecified: Secondary | ICD-10-CM | POA: Diagnosis not present

## 2021-10-06 DIAGNOSIS — N401 Enlarged prostate with lower urinary tract symptoms: Secondary | ICD-10-CM | POA: Diagnosis not present

## 2021-10-06 DIAGNOSIS — R338 Other retention of urine: Secondary | ICD-10-CM | POA: Diagnosis not present

## 2021-10-25 DIAGNOSIS — B351 Tinea unguium: Secondary | ICD-10-CM | POA: Diagnosis not present

## 2021-10-25 DIAGNOSIS — D519 Vitamin B12 deficiency anemia, unspecified: Secondary | ICD-10-CM | POA: Diagnosis not present

## 2021-10-25 DIAGNOSIS — I498 Other specified cardiac arrhythmias: Secondary | ICD-10-CM | POA: Diagnosis not present

## 2021-10-25 DIAGNOSIS — Z6829 Body mass index (BMI) 29.0-29.9, adult: Secondary | ICD-10-CM | POA: Diagnosis not present

## 2021-10-25 DIAGNOSIS — E559 Vitamin D deficiency, unspecified: Secondary | ICD-10-CM | POA: Diagnosis not present

## 2021-10-25 DIAGNOSIS — I10 Essential (primary) hypertension: Secondary | ICD-10-CM | POA: Diagnosis not present

## 2021-10-25 DIAGNOSIS — J309 Allergic rhinitis, unspecified: Secondary | ICD-10-CM | POA: Diagnosis not present

## 2021-10-25 DIAGNOSIS — J449 Chronic obstructive pulmonary disease, unspecified: Secondary | ICD-10-CM | POA: Diagnosis not present

## 2021-10-25 DIAGNOSIS — I2510 Atherosclerotic heart disease of native coronary artery without angina pectoris: Secondary | ICD-10-CM | POA: Diagnosis not present

## 2021-10-25 DIAGNOSIS — E0821 Diabetes mellitus due to underlying condition with diabetic nephropathy: Secondary | ICD-10-CM | POA: Diagnosis not present

## 2021-10-25 DIAGNOSIS — E785 Hyperlipidemia, unspecified: Secondary | ICD-10-CM | POA: Diagnosis not present

## 2021-10-25 DIAGNOSIS — N400 Enlarged prostate without lower urinary tract symptoms: Secondary | ICD-10-CM | POA: Diagnosis not present

## 2021-10-26 DIAGNOSIS — E559 Vitamin D deficiency, unspecified: Secondary | ICD-10-CM | POA: Diagnosis not present

## 2021-10-26 DIAGNOSIS — E785 Hyperlipidemia, unspecified: Secondary | ICD-10-CM | POA: Diagnosis not present

## 2021-10-26 DIAGNOSIS — Z79899 Other long term (current) drug therapy: Secondary | ICD-10-CM | POA: Diagnosis not present

## 2021-11-10 DIAGNOSIS — I2510 Atherosclerotic heart disease of native coronary artery without angina pectoris: Secondary | ICD-10-CM | POA: Diagnosis not present

## 2021-11-10 DIAGNOSIS — I420 Dilated cardiomyopathy: Secondary | ICD-10-CM | POA: Diagnosis not present

## 2021-11-10 DIAGNOSIS — E785 Hyperlipidemia, unspecified: Secondary | ICD-10-CM | POA: Diagnosis not present

## 2021-11-10 DIAGNOSIS — Z7901 Long term (current) use of anticoagulants: Secondary | ICD-10-CM | POA: Diagnosis not present

## 2021-11-10 DIAGNOSIS — E119 Type 2 diabetes mellitus without complications: Secondary | ICD-10-CM | POA: Diagnosis not present

## 2021-11-10 DIAGNOSIS — I255 Ischemic cardiomyopathy: Secondary | ICD-10-CM | POA: Diagnosis not present

## 2021-11-10 DIAGNOSIS — I480 Paroxysmal atrial fibrillation: Secondary | ICD-10-CM | POA: Diagnosis not present

## 2021-11-10 DIAGNOSIS — I10 Essential (primary) hypertension: Secondary | ICD-10-CM | POA: Diagnosis not present

## 2021-11-15 ENCOUNTER — Ambulatory Visit (INDEPENDENT_AMBULATORY_CARE_PROVIDER_SITE_OTHER): Payer: Medicare HMO | Admitting: Podiatry

## 2021-11-15 DIAGNOSIS — Z91199 Patient's noncompliance with other medical treatment and regimen due to unspecified reason: Secondary | ICD-10-CM

## 2021-11-15 NOTE — Progress Notes (Signed)
No show

## 2021-12-28 DIAGNOSIS — N401 Enlarged prostate with lower urinary tract symptoms: Secondary | ICD-10-CM | POA: Diagnosis not present

## 2021-12-28 DIAGNOSIS — R338 Other retention of urine: Secondary | ICD-10-CM | POA: Diagnosis not present

## 2022-01-12 DIAGNOSIS — N401 Enlarged prostate with lower urinary tract symptoms: Secondary | ICD-10-CM | POA: Diagnosis not present

## 2022-01-12 DIAGNOSIS — R338 Other retention of urine: Secondary | ICD-10-CM | POA: Diagnosis not present

## 2022-01-13 DIAGNOSIS — R338 Other retention of urine: Secondary | ICD-10-CM | POA: Diagnosis not present

## 2022-01-16 DIAGNOSIS — W19XXXA Unspecified fall, initial encounter: Secondary | ICD-10-CM | POA: Diagnosis not present

## 2022-01-16 DIAGNOSIS — M25561 Pain in right knee: Secondary | ICD-10-CM | POA: Diagnosis not present

## 2022-01-19 DIAGNOSIS — N401 Enlarged prostate with lower urinary tract symptoms: Secondary | ICD-10-CM | POA: Diagnosis not present

## 2022-01-19 DIAGNOSIS — R338 Other retention of urine: Secondary | ICD-10-CM | POA: Diagnosis not present

## 2022-01-24 DIAGNOSIS — I10 Essential (primary) hypertension: Secondary | ICD-10-CM | POA: Diagnosis not present

## 2022-01-24 DIAGNOSIS — D519 Vitamin B12 deficiency anemia, unspecified: Secondary | ICD-10-CM | POA: Diagnosis not present

## 2022-01-24 DIAGNOSIS — B351 Tinea unguium: Secondary | ICD-10-CM | POA: Diagnosis not present

## 2022-01-24 DIAGNOSIS — E559 Vitamin D deficiency, unspecified: Secondary | ICD-10-CM | POA: Diagnosis not present

## 2022-01-24 DIAGNOSIS — N400 Enlarged prostate without lower urinary tract symptoms: Secondary | ICD-10-CM | POA: Diagnosis not present

## 2022-01-24 DIAGNOSIS — J449 Chronic obstructive pulmonary disease, unspecified: Secondary | ICD-10-CM | POA: Diagnosis not present

## 2022-01-24 DIAGNOSIS — E0821 Diabetes mellitus due to underlying condition with diabetic nephropathy: Secondary | ICD-10-CM | POA: Diagnosis not present

## 2022-01-24 DIAGNOSIS — J309 Allergic rhinitis, unspecified: Secondary | ICD-10-CM | POA: Diagnosis not present

## 2022-01-24 DIAGNOSIS — I2510 Atherosclerotic heart disease of native coronary artery without angina pectoris: Secondary | ICD-10-CM | POA: Diagnosis not present

## 2022-01-24 DIAGNOSIS — E669 Obesity, unspecified: Secondary | ICD-10-CM | POA: Diagnosis not present

## 2022-01-24 DIAGNOSIS — E785 Hyperlipidemia, unspecified: Secondary | ICD-10-CM | POA: Diagnosis not present

## 2022-01-24 DIAGNOSIS — I498 Other specified cardiac arrhythmias: Secondary | ICD-10-CM | POA: Diagnosis not present

## 2022-02-04 DIAGNOSIS — R638 Other symptoms and signs concerning food and fluid intake: Secondary | ICD-10-CM | POA: Diagnosis not present

## 2022-02-04 DIAGNOSIS — R051 Acute cough: Secondary | ICD-10-CM | POA: Diagnosis not present

## 2022-02-04 DIAGNOSIS — R509 Fever, unspecified: Secondary | ICD-10-CM | POA: Diagnosis not present

## 2022-02-15 DIAGNOSIS — E785 Hyperlipidemia, unspecified: Secondary | ICD-10-CM | POA: Diagnosis not present

## 2022-02-15 DIAGNOSIS — I2510 Atherosclerotic heart disease of native coronary artery without angina pectoris: Secondary | ICD-10-CM | POA: Diagnosis not present

## 2022-02-23 DIAGNOSIS — E785 Hyperlipidemia, unspecified: Secondary | ICD-10-CM | POA: Diagnosis not present

## 2022-02-23 DIAGNOSIS — I255 Ischemic cardiomyopathy: Secondary | ICD-10-CM | POA: Diagnosis not present

## 2022-02-23 DIAGNOSIS — Z7901 Long term (current) use of anticoagulants: Secondary | ICD-10-CM | POA: Diagnosis not present

## 2022-02-23 DIAGNOSIS — I10 Essential (primary) hypertension: Secondary | ICD-10-CM | POA: Diagnosis not present

## 2022-02-23 DIAGNOSIS — I2510 Atherosclerotic heart disease of native coronary artery without angina pectoris: Secondary | ICD-10-CM | POA: Diagnosis not present

## 2022-02-23 DIAGNOSIS — R0789 Other chest pain: Secondary | ICD-10-CM | POA: Diagnosis not present

## 2022-02-23 DIAGNOSIS — I420 Dilated cardiomyopathy: Secondary | ICD-10-CM | POA: Diagnosis not present

## 2022-02-23 DIAGNOSIS — I480 Paroxysmal atrial fibrillation: Secondary | ICD-10-CM | POA: Diagnosis not present

## 2022-02-23 DIAGNOSIS — Z955 Presence of coronary angioplasty implant and graft: Secondary | ICD-10-CM | POA: Diagnosis not present

## 2022-02-23 DIAGNOSIS — I252 Old myocardial infarction: Secondary | ICD-10-CM | POA: Diagnosis not present

## 2022-02-24 DIAGNOSIS — I2510 Atherosclerotic heart disease of native coronary artery without angina pectoris: Secondary | ICD-10-CM | POA: Diagnosis not present

## 2022-03-07 DIAGNOSIS — N401 Enlarged prostate with lower urinary tract symptoms: Secondary | ICD-10-CM | POA: Diagnosis not present

## 2022-03-07 DIAGNOSIS — R338 Other retention of urine: Secondary | ICD-10-CM | POA: Diagnosis not present

## 2022-03-15 DIAGNOSIS — I2510 Atherosclerotic heart disease of native coronary artery without angina pectoris: Secondary | ICD-10-CM | POA: Diagnosis not present

## 2022-03-15 DIAGNOSIS — R0789 Other chest pain: Secondary | ICD-10-CM | POA: Diagnosis not present

## 2022-03-22 ENCOUNTER — Ambulatory Visit (INDEPENDENT_AMBULATORY_CARE_PROVIDER_SITE_OTHER): Payer: Medicare HMO | Admitting: Podiatry

## 2022-03-22 DIAGNOSIS — B351 Tinea unguium: Secondary | ICD-10-CM

## 2022-03-22 DIAGNOSIS — M79675 Pain in left toe(s): Secondary | ICD-10-CM

## 2022-03-22 DIAGNOSIS — M79674 Pain in right toe(s): Secondary | ICD-10-CM | POA: Diagnosis not present

## 2022-03-22 DIAGNOSIS — Z7901 Long term (current) use of anticoagulants: Secondary | ICD-10-CM

## 2022-03-22 DIAGNOSIS — E119 Type 2 diabetes mellitus without complications: Secondary | ICD-10-CM | POA: Diagnosis not present

## 2022-03-22 NOTE — Progress Notes (Signed)
  Subjective:  Patient ID: Samuel Green, male    DOB: 1946-02-24,  MRN: [id number]  Chief Complaint  Patient presents with   Nail Problem    DFC    76 y.o. male presents with the above complaint. History confirmed with patient. Patient presenting with pain related to dystrophic thickened elongated nails. Patient is unable to trim own nails related to nail dystrophy and/or mobility issues. Patient does  have a history of T2DM.   Objective:  Physical Exam: warm, good capillary refill nail exam onychomycosis of the toenails, onycholysis, and dystrophic nails DP pulses palpable, PT pulses palpable, and protective sensation intact Left Foot:  Pain with palpation of nails due to elongation and dystrophic growth.  Right Foot: Pain with palpation of nails due to elongation and dystrophic growth.   Assessment:   1. Pain due to onychomycosis of toenails of both feet   2. Diabetes mellitus without complication (HCC)   3. Current use of long term anticoagulation      Plan:  Patient was evaluated and treated and all questions answered.   #Onychomycosis with pain  -Nails palliatively debrided as below. -Educated on self-care  Procedure: Nail Debridement Rationale: Pain Type of Debridement: manual, sharp debridement. Instrumentation: Nail nipper, rotary burr. Number of Nails: 10  No follow-ups on file.         Corinna Gab, DPM [facility] / [facility]

## 2022-03-28 DIAGNOSIS — I2510 Atherosclerotic heart disease of native coronary artery without angina pectoris: Secondary | ICD-10-CM | POA: Diagnosis not present

## 2022-03-28 DIAGNOSIS — I420 Dilated cardiomyopathy: Secondary | ICD-10-CM | POA: Diagnosis not present

## 2022-03-28 DIAGNOSIS — I10 Essential (primary) hypertension: Secondary | ICD-10-CM | POA: Diagnosis not present

## 2022-03-28 DIAGNOSIS — I252 Old myocardial infarction: Secondary | ICD-10-CM | POA: Diagnosis not present

## 2022-03-28 DIAGNOSIS — I255 Ischemic cardiomyopathy: Secondary | ICD-10-CM | POA: Diagnosis not present

## 2022-03-28 DIAGNOSIS — E785 Hyperlipidemia, unspecified: Secondary | ICD-10-CM | POA: Diagnosis not present

## 2022-03-28 DIAGNOSIS — Z955 Presence of coronary angioplasty implant and graft: Secondary | ICD-10-CM | POA: Diagnosis not present

## 2022-03-28 DIAGNOSIS — I480 Paroxysmal atrial fibrillation: Secondary | ICD-10-CM | POA: Diagnosis not present

## 2022-03-28 DIAGNOSIS — Z7901 Long term (current) use of anticoagulants: Secondary | ICD-10-CM | POA: Diagnosis not present

## 2022-04-20 DIAGNOSIS — M9904 Segmental and somatic dysfunction of sacral region: Secondary | ICD-10-CM | POA: Diagnosis not present

## 2022-04-20 DIAGNOSIS — M9903 Segmental and somatic dysfunction of lumbar region: Secondary | ICD-10-CM | POA: Diagnosis not present

## 2022-04-20 DIAGNOSIS — M9902 Segmental and somatic dysfunction of thoracic region: Secondary | ICD-10-CM | POA: Diagnosis not present

## 2022-04-20 DIAGNOSIS — M9905 Segmental and somatic dysfunction of pelvic region: Secondary | ICD-10-CM | POA: Diagnosis not present

## 2022-04-27 DIAGNOSIS — J309 Allergic rhinitis, unspecified: Secondary | ICD-10-CM | POA: Diagnosis not present

## 2022-04-27 DIAGNOSIS — Z6829 Body mass index (BMI) 29.0-29.9, adult: Secondary | ICD-10-CM | POA: Diagnosis not present

## 2022-04-27 DIAGNOSIS — E559 Vitamin D deficiency, unspecified: Secondary | ICD-10-CM | POA: Diagnosis not present

## 2022-04-27 DIAGNOSIS — N400 Enlarged prostate without lower urinary tract symptoms: Secondary | ICD-10-CM | POA: Diagnosis not present

## 2022-04-27 DIAGNOSIS — E0821 Diabetes mellitus due to underlying condition with diabetic nephropathy: Secondary | ICD-10-CM | POA: Diagnosis not present

## 2022-04-27 DIAGNOSIS — J449 Chronic obstructive pulmonary disease, unspecified: Secondary | ICD-10-CM | POA: Diagnosis not present

## 2022-04-27 DIAGNOSIS — I2510 Atherosclerotic heart disease of native coronary artery without angina pectoris: Secondary | ICD-10-CM | POA: Diagnosis not present

## 2022-04-27 DIAGNOSIS — I498 Other specified cardiac arrhythmias: Secondary | ICD-10-CM | POA: Diagnosis not present

## 2022-04-27 DIAGNOSIS — E785 Hyperlipidemia, unspecified: Secondary | ICD-10-CM | POA: Diagnosis not present

## 2022-04-27 DIAGNOSIS — I10 Essential (primary) hypertension: Secondary | ICD-10-CM | POA: Diagnosis not present

## 2022-04-28 DIAGNOSIS — E0821 Diabetes mellitus due to underlying condition with diabetic nephropathy: Secondary | ICD-10-CM | POA: Diagnosis not present

## 2022-04-28 DIAGNOSIS — E559 Vitamin D deficiency, unspecified: Secondary | ICD-10-CM | POA: Diagnosis not present

## 2022-04-28 DIAGNOSIS — D519 Vitamin B12 deficiency anemia, unspecified: Secondary | ICD-10-CM | POA: Diagnosis not present

## 2022-04-28 DIAGNOSIS — Z79899 Other long term (current) drug therapy: Secondary | ICD-10-CM | POA: Diagnosis not present

## 2022-04-28 DIAGNOSIS — E785 Hyperlipidemia, unspecified: Secondary | ICD-10-CM | POA: Diagnosis not present

## 2022-04-29 DIAGNOSIS — Z79899 Other long term (current) drug therapy: Secondary | ICD-10-CM | POA: Diagnosis not present

## 2022-04-29 DIAGNOSIS — E0821 Diabetes mellitus due to underlying condition with diabetic nephropathy: Secondary | ICD-10-CM | POA: Diagnosis not present

## 2022-05-04 DIAGNOSIS — M9902 Segmental and somatic dysfunction of thoracic region: Secondary | ICD-10-CM | POA: Diagnosis not present

## 2022-05-04 DIAGNOSIS — M9905 Segmental and somatic dysfunction of pelvic region: Secondary | ICD-10-CM | POA: Diagnosis not present

## 2022-05-04 DIAGNOSIS — M9904 Segmental and somatic dysfunction of sacral region: Secondary | ICD-10-CM | POA: Diagnosis not present

## 2022-05-04 DIAGNOSIS — M9903 Segmental and somatic dysfunction of lumbar region: Secondary | ICD-10-CM | POA: Diagnosis not present

## 2022-05-18 DIAGNOSIS — I252 Old myocardial infarction: Secondary | ICD-10-CM | POA: Diagnosis not present

## 2022-05-18 DIAGNOSIS — I255 Ischemic cardiomyopathy: Secondary | ICD-10-CM | POA: Diagnosis not present

## 2022-05-18 DIAGNOSIS — Z7901 Long term (current) use of anticoagulants: Secondary | ICD-10-CM | POA: Diagnosis not present

## 2022-05-18 DIAGNOSIS — I480 Paroxysmal atrial fibrillation: Secondary | ICD-10-CM | POA: Diagnosis not present

## 2022-05-18 DIAGNOSIS — Z955 Presence of coronary angioplasty implant and graft: Secondary | ICD-10-CM | POA: Diagnosis not present

## 2022-05-18 DIAGNOSIS — I2510 Atherosclerotic heart disease of native coronary artery without angina pectoris: Secondary | ICD-10-CM | POA: Diagnosis not present

## 2022-05-18 DIAGNOSIS — I420 Dilated cardiomyopathy: Secondary | ICD-10-CM | POA: Diagnosis not present

## 2022-05-18 DIAGNOSIS — I10 Essential (primary) hypertension: Secondary | ICD-10-CM | POA: Diagnosis not present

## 2022-05-18 DIAGNOSIS — E119 Type 2 diabetes mellitus without complications: Secondary | ICD-10-CM | POA: Diagnosis not present

## 2022-05-24 IMAGING — MR MR PROSTATE WO/W CM
12 series · 48 of 48 positions shown · IV contrast (multihance)
Comparison: None.

CLINICAL DATA: Elevated PSA.

EXAM:
MR PROSTATE WITHOUT AND WITH CONTRAST
TECHNIQUE: Multiplanar multisequence MRI images were obtained of the pelvis
centered about the prostate. Pre and post contrast images were
obtained.
CONTRAST:  20mL MULTIHANCE GADOBENATE DIMEGLUMINE 529 MG/ML IV SOLN

[Series 3: T2 · coronal · 3.0mm · 0.56mm/px · 1 of 29 slices shown (1 of 5)]
[im 1/29]
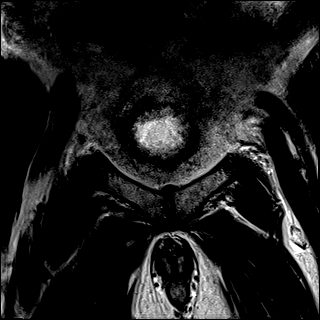

[Series 4: T1 · axial · 5.0mm · 1.25mm/px · 1 of 80 slices shown]
[im 1/80]
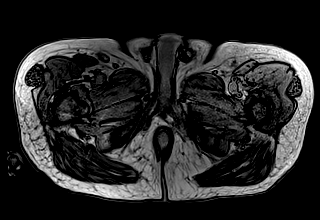

[Series 5: DWI · axial · 3.0mm · 1.75mm/px · 1 of 90 slices shown (1 of 3)]
[im 1/90]
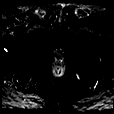

[Series 6: DWI · axial · 3.0mm · 1.75mm/px · 1 of 30 slices shown (2 of 3)]
[im 1/30]
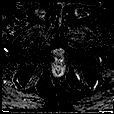

[Series 7: DWI · axial · 3.0mm · 1.75mm/px · 1 of 30 slices shown (3 of 3)]
[im 1/30]
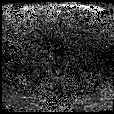

[Series 8: T2 · axial · 3.0mm · 0.56mm/px · 1 of 30 slices shown (2 of 5)]
[im 1/30]
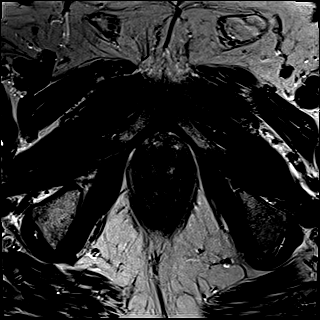

[Series 9: T2 · coronal · 3.0mm · 0.56mm/px · 1 of 29 slices shown (3 of 5)]
[im 1/29]
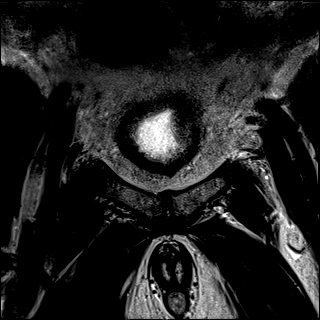

[Series 10: T2 · axial · 1.0mm · 1.04mm/px · z∈[+25,+120]mm · 2 of 96 slices shown (4 of 5)]
[im 1/96]
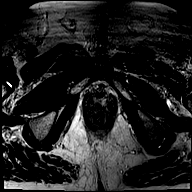
[im 96/96]
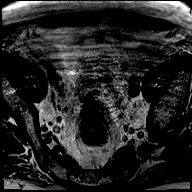

[Series 11: T2 · axial · 3.0mm · 0.56mm/px · 1 of 30 slices shown (5 of 5)]
[im 1/30]
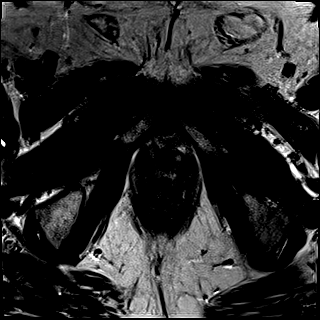

[Series 12: pre t1_twist_tra_dyn · axial · non-contrast · 3.5mm · 0.83mm/px · 1 of 26 slices shown]
[im 1/26]
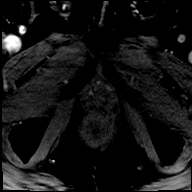

[Series 13: post t1_twist_tra_dyn-copy center · axial · non-contrast · 3.5mm · 0.83mm/px · z∈[+25,+113]mm · 19 of 780 slices shown]
[im 1/780]
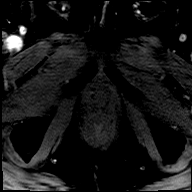
[im 44/780]
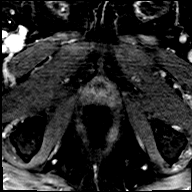
[im 87/780]
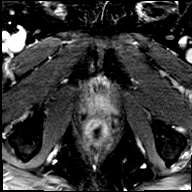
[im 130/780]
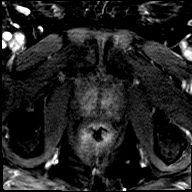
[im 174/780]
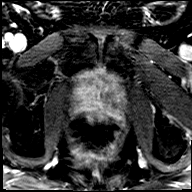
[im 217/780]
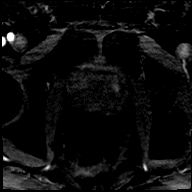
[im 260/780]
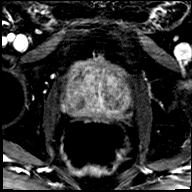
[im 303/780]
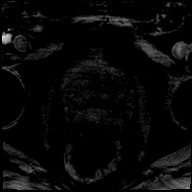
[im 347/780]
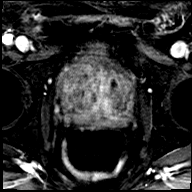
[im 390/780]
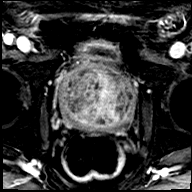
[im 433/780]
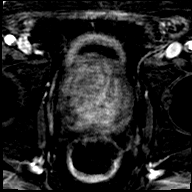
[im 477/780]
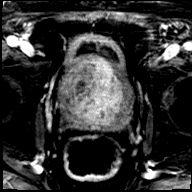
[im 520/780]
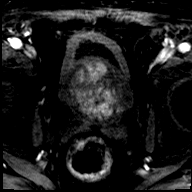
[im 563/780]
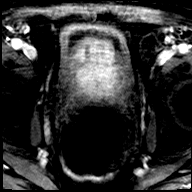
[im 606/780]
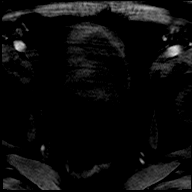
[im 650/780]
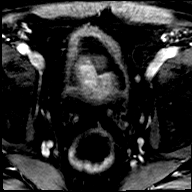
[im 693/780]
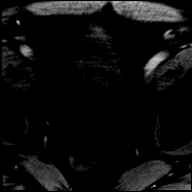
[im 736/780]
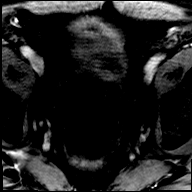
[im 780/780]
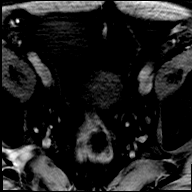

[Series 14: post t1_twist_tra_dyn-copy cent_sub · axial · 3.5mm · 0.83mm/px · z∈[+25,+113]mm · 18 of 754 slices shown]
[im 1/754]
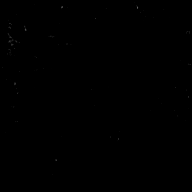
[im 45/754]
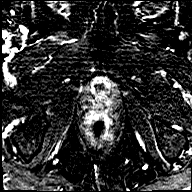
[im 89/754]
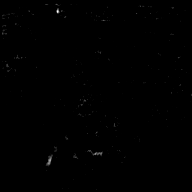
[im 133/754]
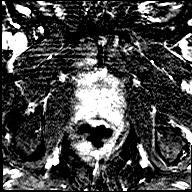
[im 178/754]
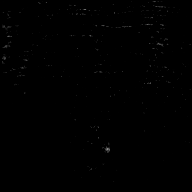
[im 222/754]
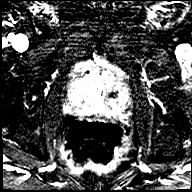
[im 266/754]
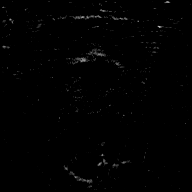
[im 311/754]
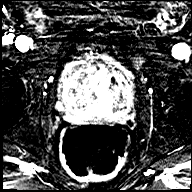
[im 355/754]
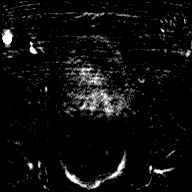
[im 399/754]
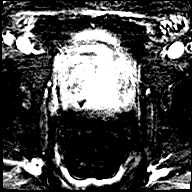
[im 443/754]
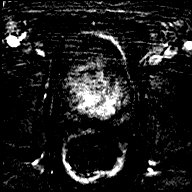
[im 488/754]
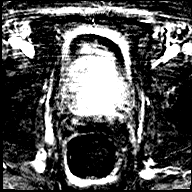
[im 532/754]
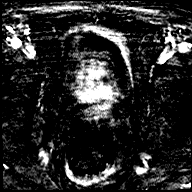
[im 576/754]
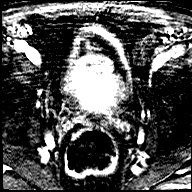
[im 621/754]
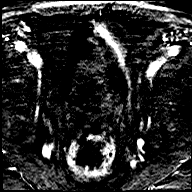
[im 665/754]
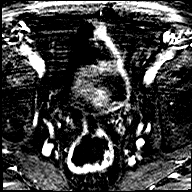
[im 709/754]
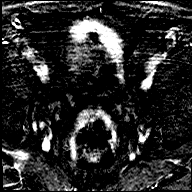
[im 754/754]
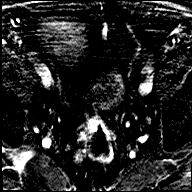

[48 of 48 positions shown; findings below may reference images not displayed]

FINDINGS: Prostate: Image degradation by motion artifact noted.

-- Peripheral Zone: Diffuse thinning due to central gland
hypertrophy noted. No abnormality seen on ADC and high b-value DWI
sequences.

-- Transition/Central Zone: Circumscribed BPH nodules are noted, but
no suspicious nodules with obscured or non-circumscribed margins
seen.

-- Measurements/Volume:  7.6 x 5.4 x 6.2 cm (volume = 130 cm^3)

Transcapsular spread:  Absent

Seminal vesicle involvement:  Absent

Neurovascular bundle involvement:  Absent

Pelvic adenopathy: None visualized

Bone metastasis: None visualized

Other:  Sigmoid diverticulosis, without evidence of diverticulitis.
IMPRESSION: Image degradation by motion artifact noted. No radiographic evidence
of high-grade prostate carcinoma. PI-RADS 1: Very Low (clinically
significant cancer is highly unlikely to be present)

## 2022-05-25 DIAGNOSIS — N401 Enlarged prostate with lower urinary tract symptoms: Secondary | ICD-10-CM | POA: Diagnosis not present

## 2022-05-25 DIAGNOSIS — R972 Elevated prostate specific antigen [PSA]: Secondary | ICD-10-CM | POA: Diagnosis not present

## 2022-06-04 DIAGNOSIS — I2510 Atherosclerotic heart disease of native coronary artery without angina pectoris: Secondary | ICD-10-CM | POA: Diagnosis not present

## 2022-06-04 DIAGNOSIS — I480 Paroxysmal atrial fibrillation: Secondary | ICD-10-CM | POA: Diagnosis not present

## 2022-06-04 DIAGNOSIS — E785 Hyperlipidemia, unspecified: Secondary | ICD-10-CM | POA: Diagnosis not present

## 2022-06-04 DIAGNOSIS — M1990 Unspecified osteoarthritis, unspecified site: Secondary | ICD-10-CM | POA: Diagnosis not present

## 2022-06-04 DIAGNOSIS — R001 Bradycardia, unspecified: Secondary | ICD-10-CM | POA: Diagnosis not present

## 2022-06-04 DIAGNOSIS — Z87440 Personal history of urinary (tract) infections: Secondary | ICD-10-CM | POA: Diagnosis not present

## 2022-06-04 DIAGNOSIS — Z7952 Long term (current) use of systemic steroids: Secondary | ICD-10-CM | POA: Diagnosis not present

## 2022-06-04 DIAGNOSIS — I5023 Acute on chronic systolic (congestive) heart failure: Secondary | ICD-10-CM | POA: Diagnosis not present

## 2022-06-04 DIAGNOSIS — R9431 Abnormal electrocardiogram [ECG] [EKG]: Secondary | ICD-10-CM | POA: Diagnosis not present

## 2022-06-04 DIAGNOSIS — F419 Anxiety disorder, unspecified: Secondary | ICD-10-CM | POA: Diagnosis not present

## 2022-06-04 DIAGNOSIS — Z85118 Personal history of other malignant neoplasm of bronchus and lung: Secondary | ICD-10-CM | POA: Diagnosis not present

## 2022-06-04 DIAGNOSIS — Z7901 Long term (current) use of anticoagulants: Secondary | ICD-10-CM | POA: Diagnosis not present

## 2022-06-04 DIAGNOSIS — E1122 Type 2 diabetes mellitus with diabetic chronic kidney disease: Secondary | ICD-10-CM | POA: Diagnosis not present

## 2022-06-04 DIAGNOSIS — Z8673 Personal history of transient ischemic attack (TIA), and cerebral infarction without residual deficits: Secondary | ICD-10-CM | POA: Diagnosis not present

## 2022-06-04 DIAGNOSIS — Z8719 Personal history of other diseases of the digestive system: Secondary | ICD-10-CM | POA: Diagnosis not present

## 2022-06-04 DIAGNOSIS — R739 Hyperglycemia, unspecified: Secondary | ICD-10-CM | POA: Diagnosis not present

## 2022-06-04 DIAGNOSIS — J90 Pleural effusion, not elsewhere classified: Secondary | ICD-10-CM | POA: Diagnosis not present

## 2022-06-04 DIAGNOSIS — I340 Nonrheumatic mitral (valve) insufficiency: Secondary | ICD-10-CM | POA: Diagnosis not present

## 2022-06-04 DIAGNOSIS — Z885 Allergy status to narcotic agent status: Secondary | ICD-10-CM | POA: Diagnosis not present

## 2022-06-04 DIAGNOSIS — J441 Chronic obstructive pulmonary disease with (acute) exacerbation: Secondary | ICD-10-CM | POA: Diagnosis not present

## 2022-06-04 DIAGNOSIS — Z955 Presence of coronary angioplasty implant and graft: Secondary | ICD-10-CM | POA: Diagnosis not present

## 2022-06-04 DIAGNOSIS — I252 Old myocardial infarction: Secondary | ICD-10-CM | POA: Diagnosis not present

## 2022-06-04 DIAGNOSIS — R4702 Dysphasia: Secondary | ICD-10-CM | POA: Diagnosis not present

## 2022-06-04 DIAGNOSIS — Z87891 Personal history of nicotine dependence: Secondary | ICD-10-CM | POA: Diagnosis not present

## 2022-06-04 DIAGNOSIS — R0602 Shortness of breath: Secondary | ICD-10-CM | POA: Diagnosis not present

## 2022-06-04 DIAGNOSIS — N400 Enlarged prostate without lower urinary tract symptoms: Secondary | ICD-10-CM | POA: Diagnosis not present

## 2022-06-04 DIAGNOSIS — I10 Essential (primary) hypertension: Secondary | ICD-10-CM | POA: Diagnosis not present

## 2022-06-04 DIAGNOSIS — N183 Chronic kidney disease, stage 3 unspecified: Secondary | ICD-10-CM | POA: Diagnosis not present

## 2022-06-04 DIAGNOSIS — Z743 Need for continuous supervision: Secondary | ICD-10-CM | POA: Diagnosis not present

## 2022-06-04 DIAGNOSIS — K219 Gastro-esophageal reflux disease without esophagitis: Secondary | ICD-10-CM | POA: Diagnosis not present

## 2022-06-04 DIAGNOSIS — I255 Ischemic cardiomyopathy: Secondary | ICD-10-CM | POA: Diagnosis not present

## 2022-06-04 DIAGNOSIS — I130 Hypertensive heart and chronic kidney disease with heart failure and stage 1 through stage 4 chronic kidney disease, or unspecified chronic kidney disease: Secondary | ICD-10-CM | POA: Diagnosis not present

## 2022-06-04 DIAGNOSIS — I509 Heart failure, unspecified: Secondary | ICD-10-CM | POA: Diagnosis not present

## 2022-06-05 DIAGNOSIS — I5023 Acute on chronic systolic (congestive) heart failure: Secondary | ICD-10-CM | POA: Diagnosis not present

## 2022-06-05 DIAGNOSIS — J441 Chronic obstructive pulmonary disease with (acute) exacerbation: Secondary | ICD-10-CM | POA: Diagnosis not present

## 2022-06-05 DIAGNOSIS — I130 Hypertensive heart and chronic kidney disease with heart failure and stage 1 through stage 4 chronic kidney disease, or unspecified chronic kidney disease: Secondary | ICD-10-CM | POA: Diagnosis not present

## 2022-06-06 DIAGNOSIS — J441 Chronic obstructive pulmonary disease with (acute) exacerbation: Secondary | ICD-10-CM | POA: Diagnosis not present

## 2022-06-06 DIAGNOSIS — I5023 Acute on chronic systolic (congestive) heart failure: Secondary | ICD-10-CM | POA: Diagnosis not present

## 2022-06-06 DIAGNOSIS — I130 Hypertensive heart and chronic kidney disease with heart failure and stage 1 through stage 4 chronic kidney disease, or unspecified chronic kidney disease: Secondary | ICD-10-CM | POA: Diagnosis not present

## 2022-06-07 DIAGNOSIS — I10 Essential (primary) hypertension: Secondary | ICD-10-CM | POA: Diagnosis not present

## 2022-06-07 DIAGNOSIS — I420 Dilated cardiomyopathy: Secondary | ICD-10-CM | POA: Diagnosis not present

## 2022-06-07 DIAGNOSIS — Z955 Presence of coronary angioplasty implant and graft: Secondary | ICD-10-CM | POA: Diagnosis not present

## 2022-06-07 DIAGNOSIS — I255 Ischemic cardiomyopathy: Secondary | ICD-10-CM | POA: Diagnosis not present

## 2022-06-07 DIAGNOSIS — I480 Paroxysmal atrial fibrillation: Secondary | ICD-10-CM | POA: Diagnosis not present

## 2022-06-07 DIAGNOSIS — I110 Hypertensive heart disease with heart failure: Secondary | ICD-10-CM | POA: Diagnosis not present

## 2022-06-07 DIAGNOSIS — Z7901 Long term (current) use of anticoagulants: Secondary | ICD-10-CM | POA: Diagnosis not present

## 2022-06-07 DIAGNOSIS — R001 Bradycardia, unspecified: Secondary | ICD-10-CM | POA: Diagnosis not present

## 2022-06-07 DIAGNOSIS — I2510 Atherosclerotic heart disease of native coronary artery without angina pectoris: Secondary | ICD-10-CM | POA: Diagnosis not present

## 2022-06-21 DIAGNOSIS — R0609 Other forms of dyspnea: Secondary | ICD-10-CM | POA: Diagnosis not present

## 2022-06-21 DIAGNOSIS — I255 Ischemic cardiomyopathy: Secondary | ICD-10-CM | POA: Diagnosis not present

## 2022-06-21 DIAGNOSIS — I420 Dilated cardiomyopathy: Secondary | ICD-10-CM | POA: Diagnosis not present

## 2022-06-21 DIAGNOSIS — R0600 Dyspnea, unspecified: Secondary | ICD-10-CM | POA: Diagnosis not present

## 2022-06-21 DIAGNOSIS — I480 Paroxysmal atrial fibrillation: Secondary | ICD-10-CM | POA: Diagnosis not present

## 2022-06-24 ENCOUNTER — Ambulatory Visit (INDEPENDENT_AMBULATORY_CARE_PROVIDER_SITE_OTHER): Payer: Medicare HMO | Admitting: Podiatry

## 2022-06-24 DIAGNOSIS — B351 Tinea unguium: Secondary | ICD-10-CM

## 2022-06-24 DIAGNOSIS — M79675 Pain in left toe(s): Secondary | ICD-10-CM | POA: Diagnosis not present

## 2022-06-24 DIAGNOSIS — M79674 Pain in right toe(s): Secondary | ICD-10-CM | POA: Diagnosis not present

## 2022-06-24 NOTE — Progress Notes (Signed)
  Subjective:  Patient ID: Samuel Green, male    DOB: Oct 10, 1945,  MRN: [id number]  Chief Complaint  Patient presents with   Nail Problem    Diabetic Foot Care     77 y.o. male presents with the above complaint. History confirmed with patient. Patient presenting with pain related to dystrophic thickened elongated nails. Patient is unable to trim own nails related to nail dystrophy and/or mobility issues. Patient does  have a history of T2DM.   Objective:  Physical Exam: warm, good capillary refill nail exam onychomycosis of the toenails, onycholysis, and dystrophic nails DP pulses palpable, PT pulses palpable, and protective sensation intact Left Foot:  Pain with palpation of nails due to elongation and dystrophic growth.  Right Foot: Pain with palpation of nails due to elongation and dystrophic growth.   Assessment:   1. Pain due to onychomycosis of toenails of both feet       Plan:  Patient was evaluated and treated and all questions answered.   #Onychomycosis with pain  -Nails palliatively debrided as below. -Educated on self-care  Procedure: Nail Debridement Rationale: Pain Type of Debridement: manual, sharp debridement. Instrumentation: Nail nipper, rotary burr. Number of Nails: 10  Return in about 3 months (around 09/24/2022) for [facility].         Everitt Amber, DPM [facility] / [facility]

## 2022-06-27 DIAGNOSIS — Z955 Presence of coronary angioplasty implant and graft: Secondary | ICD-10-CM | POA: Diagnosis not present

## 2022-06-27 DIAGNOSIS — I420 Dilated cardiomyopathy: Secondary | ICD-10-CM | POA: Diagnosis not present

## 2022-06-27 DIAGNOSIS — I255 Ischemic cardiomyopathy: Secondary | ICD-10-CM | POA: Diagnosis not present

## 2022-06-27 DIAGNOSIS — I252 Old myocardial infarction: Secondary | ICD-10-CM | POA: Diagnosis not present

## 2022-06-27 DIAGNOSIS — I5022 Chronic systolic (congestive) heart failure: Secondary | ICD-10-CM | POA: Diagnosis not present

## 2022-06-27 DIAGNOSIS — I480 Paroxysmal atrial fibrillation: Secondary | ICD-10-CM | POA: Diagnosis not present

## 2022-06-27 DIAGNOSIS — Z7901 Long term (current) use of anticoagulants: Secondary | ICD-10-CM | POA: Diagnosis not present

## 2022-06-27 DIAGNOSIS — E785 Hyperlipidemia, unspecified: Secondary | ICD-10-CM | POA: Diagnosis not present

## 2022-06-27 DIAGNOSIS — I2510 Atherosclerotic heart disease of native coronary artery without angina pectoris: Secondary | ICD-10-CM | POA: Diagnosis not present

## 2022-06-28 DIAGNOSIS — I480 Paroxysmal atrial fibrillation: Secondary | ICD-10-CM | POA: Diagnosis not present

## 2022-06-28 DIAGNOSIS — R001 Bradycardia, unspecified: Secondary | ICD-10-CM | POA: Diagnosis not present

## 2022-06-29 DIAGNOSIS — I480 Paroxysmal atrial fibrillation: Secondary | ICD-10-CM | POA: Diagnosis not present

## 2022-06-29 DIAGNOSIS — R001 Bradycardia, unspecified: Secondary | ICD-10-CM | POA: Diagnosis not present

## 2022-07-04 DIAGNOSIS — I509 Heart failure, unspecified: Secondary | ICD-10-CM | POA: Diagnosis not present

## 2022-07-06 DIAGNOSIS — M9904 Segmental and somatic dysfunction of sacral region: Secondary | ICD-10-CM | POA: Diagnosis not present

## 2022-07-06 DIAGNOSIS — M9905 Segmental and somatic dysfunction of pelvic region: Secondary | ICD-10-CM | POA: Diagnosis not present

## 2022-07-06 DIAGNOSIS — M9902 Segmental and somatic dysfunction of thoracic region: Secondary | ICD-10-CM | POA: Diagnosis not present

## 2022-07-06 DIAGNOSIS — M9903 Segmental and somatic dysfunction of lumbar region: Secondary | ICD-10-CM | POA: Diagnosis not present

## 2022-07-07 DIAGNOSIS — I255 Ischemic cardiomyopathy: Secondary | ICD-10-CM | POA: Diagnosis not present

## 2022-07-07 DIAGNOSIS — I480 Paroxysmal atrial fibrillation: Secondary | ICD-10-CM | POA: Diagnosis not present

## 2022-07-07 DIAGNOSIS — I420 Dilated cardiomyopathy: Secondary | ICD-10-CM | POA: Diagnosis not present

## 2022-07-07 DIAGNOSIS — R0609 Other forms of dyspnea: Secondary | ICD-10-CM | POA: Diagnosis not present

## 2022-07-12 DIAGNOSIS — I420 Dilated cardiomyopathy: Secondary | ICD-10-CM | POA: Diagnosis not present

## 2022-07-12 DIAGNOSIS — I255 Ischemic cardiomyopathy: Secondary | ICD-10-CM | POA: Diagnosis not present

## 2022-07-15 DIAGNOSIS — I509 Heart failure, unspecified: Secondary | ICD-10-CM | POA: Diagnosis not present

## 2022-07-15 DIAGNOSIS — R062 Wheezing: Secondary | ICD-10-CM | POA: Diagnosis not present

## 2022-07-15 DIAGNOSIS — I4891 Unspecified atrial fibrillation: Secondary | ICD-10-CM | POA: Diagnosis not present

## 2022-07-15 DIAGNOSIS — J441 Chronic obstructive pulmonary disease with (acute) exacerbation: Secondary | ICD-10-CM | POA: Diagnosis not present

## 2022-07-15 DIAGNOSIS — I110 Hypertensive heart disease with heart failure: Secondary | ICD-10-CM | POA: Diagnosis not present

## 2022-07-15 DIAGNOSIS — R0602 Shortness of breath: Secondary | ICD-10-CM | POA: Diagnosis not present

## 2022-07-17 DIAGNOSIS — E785 Hyperlipidemia, unspecified: Secondary | ICD-10-CM | POA: Diagnosis not present

## 2022-07-17 DIAGNOSIS — I2510 Atherosclerotic heart disease of native coronary artery without angina pectoris: Secondary | ICD-10-CM | POA: Diagnosis not present

## 2022-07-20 ENCOUNTER — Telehealth: Payer: Self-pay

## 2022-07-20 NOTE — Telephone Encounter (Signed)
     Patient  visit on 3/29  at [facility]   Have you been able to follow up with your primary care physician? Yes   The patient was or was not able to obtain any needed medicine or equipment. Yes   Are there diet recommendations that you are having difficulty following? Na  Patient expresses understanding of discharge instructions and education provided has no other needs at this time.  Yes      [place] [phone] [address], [place], [place] [address] Phone: [phone] Email: [email or web address]

## 2022-07-25 DIAGNOSIS — Z6829 Body mass index (BMI) 29.0-29.9, adult: Secondary | ICD-10-CM | POA: Diagnosis not present

## 2022-07-25 DIAGNOSIS — I2510 Atherosclerotic heart disease of native coronary artery without angina pectoris: Secondary | ICD-10-CM | POA: Diagnosis not present

## 2022-07-25 DIAGNOSIS — E559 Vitamin D deficiency, unspecified: Secondary | ICD-10-CM | POA: Diagnosis not present

## 2022-07-25 DIAGNOSIS — E0821 Diabetes mellitus due to underlying condition with diabetic nephropathy: Secondary | ICD-10-CM | POA: Diagnosis not present

## 2022-07-25 DIAGNOSIS — I10 Essential (primary) hypertension: Secondary | ICD-10-CM | POA: Diagnosis not present

## 2022-07-25 DIAGNOSIS — E785 Hyperlipidemia, unspecified: Secondary | ICD-10-CM | POA: Diagnosis not present

## 2022-07-25 DIAGNOSIS — R69 Illness, unspecified: Secondary | ICD-10-CM | POA: Diagnosis not present

## 2022-08-01 DIAGNOSIS — M9905 Segmental and somatic dysfunction of pelvic region: Secondary | ICD-10-CM | POA: Diagnosis not present

## 2022-08-01 DIAGNOSIS — I110 Hypertensive heart disease with heart failure: Secondary | ICD-10-CM | POA: Diagnosis not present

## 2022-08-01 DIAGNOSIS — M9903 Segmental and somatic dysfunction of lumbar region: Secondary | ICD-10-CM | POA: Diagnosis not present

## 2022-08-01 DIAGNOSIS — Z7901 Long term (current) use of anticoagulants: Secondary | ICD-10-CM | POA: Diagnosis not present

## 2022-08-01 DIAGNOSIS — I2510 Atherosclerotic heart disease of native coronary artery without angina pectoris: Secondary | ICD-10-CM | POA: Diagnosis not present

## 2022-08-01 DIAGNOSIS — M9904 Segmental and somatic dysfunction of sacral region: Secondary | ICD-10-CM | POA: Diagnosis not present

## 2022-08-01 DIAGNOSIS — I5022 Chronic systolic (congestive) heart failure: Secondary | ICD-10-CM | POA: Diagnosis not present

## 2022-08-01 DIAGNOSIS — I480 Paroxysmal atrial fibrillation: Secondary | ICD-10-CM | POA: Diagnosis not present

## 2022-08-01 DIAGNOSIS — I420 Dilated cardiomyopathy: Secondary | ICD-10-CM | POA: Diagnosis not present

## 2022-08-01 DIAGNOSIS — I252 Old myocardial infarction: Secondary | ICD-10-CM | POA: Diagnosis not present

## 2022-08-01 DIAGNOSIS — M9902 Segmental and somatic dysfunction of thoracic region: Secondary | ICD-10-CM | POA: Diagnosis not present

## 2022-08-01 DIAGNOSIS — E785 Hyperlipidemia, unspecified: Secondary | ICD-10-CM | POA: Diagnosis not present

## 2022-08-01 DIAGNOSIS — I255 Ischemic cardiomyopathy: Secondary | ICD-10-CM | POA: Diagnosis not present

## 2022-08-04 DIAGNOSIS — I5022 Chronic systolic (congestive) heart failure: Secondary | ICD-10-CM | POA: Diagnosis not present

## 2022-08-05 DIAGNOSIS — M9902 Segmental and somatic dysfunction of thoracic region: Secondary | ICD-10-CM | POA: Diagnosis not present

## 2022-08-05 DIAGNOSIS — M9903 Segmental and somatic dysfunction of lumbar region: Secondary | ICD-10-CM | POA: Diagnosis not present

## 2022-08-05 DIAGNOSIS — M9904 Segmental and somatic dysfunction of sacral region: Secondary | ICD-10-CM | POA: Diagnosis not present

## 2022-08-05 DIAGNOSIS — M9905 Segmental and somatic dysfunction of pelvic region: Secondary | ICD-10-CM | POA: Diagnosis not present

## 2022-08-29 DIAGNOSIS — M9904 Segmental and somatic dysfunction of sacral region: Secondary | ICD-10-CM | POA: Diagnosis not present

## 2022-08-29 DIAGNOSIS — M9905 Segmental and somatic dysfunction of pelvic region: Secondary | ICD-10-CM | POA: Diagnosis not present

## 2022-08-29 DIAGNOSIS — M9902 Segmental and somatic dysfunction of thoracic region: Secondary | ICD-10-CM | POA: Diagnosis not present

## 2022-08-29 DIAGNOSIS — M9903 Segmental and somatic dysfunction of lumbar region: Secondary | ICD-10-CM | POA: Diagnosis not present

## 2022-08-30 DIAGNOSIS — I130 Hypertensive heart and chronic kidney disease with heart failure and stage 1 through stage 4 chronic kidney disease, or unspecified chronic kidney disease: Secondary | ICD-10-CM | POA: Diagnosis not present

## 2022-08-30 DIAGNOSIS — Z79899 Other long term (current) drug therapy: Secondary | ICD-10-CM | POA: Diagnosis not present

## 2022-08-30 DIAGNOSIS — J45909 Unspecified asthma, uncomplicated: Secondary | ICD-10-CM | POA: Diagnosis not present

## 2022-08-30 DIAGNOSIS — Z7901 Long term (current) use of anticoagulants: Secondary | ICD-10-CM | POA: Diagnosis not present

## 2022-08-30 DIAGNOSIS — I255 Ischemic cardiomyopathy: Secondary | ICD-10-CM | POA: Diagnosis not present

## 2022-08-30 DIAGNOSIS — N183 Chronic kidney disease, stage 3 unspecified: Secondary | ICD-10-CM | POA: Diagnosis not present

## 2022-08-30 DIAGNOSIS — I480 Paroxysmal atrial fibrillation: Secondary | ICD-10-CM | POA: Diagnosis not present

## 2022-08-30 DIAGNOSIS — I5022 Chronic systolic (congestive) heart failure: Secondary | ICD-10-CM | POA: Diagnosis not present

## 2022-08-30 DIAGNOSIS — I2510 Atherosclerotic heart disease of native coronary artery without angina pectoris: Secondary | ICD-10-CM | POA: Diagnosis not present

## 2022-08-30 DIAGNOSIS — Z7982 Long term (current) use of aspirin: Secondary | ICD-10-CM | POA: Diagnosis not present

## 2022-08-30 DIAGNOSIS — Z955 Presence of coronary angioplasty implant and graft: Secondary | ICD-10-CM | POA: Diagnosis not present

## 2022-08-30 DIAGNOSIS — J449 Chronic obstructive pulmonary disease, unspecified: Secondary | ICD-10-CM | POA: Diagnosis not present

## 2022-09-07 DIAGNOSIS — Z9181 History of falling: Secondary | ICD-10-CM | POA: Diagnosis not present

## 2022-09-07 DIAGNOSIS — Z0000 Encounter for general adult medical examination without abnormal findings: Secondary | ICD-10-CM | POA: Diagnosis not present

## 2022-09-16 DIAGNOSIS — E785 Hyperlipidemia, unspecified: Secondary | ICD-10-CM | POA: Diagnosis not present

## 2022-09-16 DIAGNOSIS — I2510 Atherosclerotic heart disease of native coronary artery without angina pectoris: Secondary | ICD-10-CM | POA: Diagnosis not present

## 2022-09-26 ENCOUNTER — Ambulatory Visit (INDEPENDENT_AMBULATORY_CARE_PROVIDER_SITE_OTHER): Payer: Medicare HMO | Admitting: Podiatry

## 2022-09-26 DIAGNOSIS — M79674 Pain in right toe(s): Secondary | ICD-10-CM

## 2022-09-26 DIAGNOSIS — M79675 Pain in left toe(s): Secondary | ICD-10-CM | POA: Diagnosis not present

## 2022-09-26 DIAGNOSIS — Z7901 Long term (current) use of anticoagulants: Secondary | ICD-10-CM

## 2022-09-26 DIAGNOSIS — B351 Tinea unguium: Secondary | ICD-10-CM | POA: Diagnosis not present

## 2022-09-26 DIAGNOSIS — E119 Type 2 diabetes mellitus without complications: Secondary | ICD-10-CM

## 2022-09-26 NOTE — Progress Notes (Signed)
  Subjective:  Patient ID: Samuel Green, male    DOB: [birth year],  MRN: [id number]  Chief Complaint  Patient presents with   Nail Problem    Diabetic Foot Care. Patient takes oral medication for diabetes control.     77 y.o. male presents with the above complaint. History confirmed with patient. Patient presenting with pain related to dystrophic thickened elongated nails. Patient is unable to trim own nails related to nail dystrophy and/or mobility issues. Patient does  have a history of T2DM.   Objective:  Physical Exam: warm, good capillary refill nail exam onychomycosis of the toenails, onycholysis, and dystrophic nails DP pulses palpable, PT pulses palpable, and protective sensation intact Left Foot:  Pain with palpation of nails due to elongation and dystrophic growth.  Right Foot: Pain with palpation of nails due to elongation and dystrophic growth.   Assessment:   1. Pain due to onychomycosis of toenails of both feet   2. Diabetes mellitus without complication (HCC)   3. Current use of long term anticoagulation        Plan:  Patient was evaluated and treated and all questions answered.   #Onychomycosis with pain  -Nails palliatively debrided as below. -Educated on self-care  Procedure: Nail Debridement Rationale: Pain Type of Debridement: manual, sharp debridement. Instrumentation: Nail nipper, rotary burr. Number of Nails: 10  Return in about 3 months (around 12/27/2022) for [facility].         Corinna Gab, DPM [facility] / [facility]

## 2022-10-07 DIAGNOSIS — I080 Rheumatic disorders of both mitral and aortic valves: Secondary | ICD-10-CM | POA: Diagnosis not present

## 2022-10-07 DIAGNOSIS — I509 Heart failure, unspecified: Secondary | ICD-10-CM | POA: Diagnosis not present

## 2022-10-25 DIAGNOSIS — I480 Paroxysmal atrial fibrillation: Secondary | ICD-10-CM | POA: Diagnosis not present

## 2022-10-25 DIAGNOSIS — I2510 Atherosclerotic heart disease of native coronary artery without angina pectoris: Secondary | ICD-10-CM | POA: Diagnosis not present

## 2022-10-25 DIAGNOSIS — I5022 Chronic systolic (congestive) heart failure: Secondary | ICD-10-CM | POA: Diagnosis not present

## 2022-10-25 DIAGNOSIS — Z7901 Long term (current) use of anticoagulants: Secondary | ICD-10-CM | POA: Diagnosis not present

## 2022-10-25 DIAGNOSIS — R0789 Other chest pain: Secondary | ICD-10-CM | POA: Diagnosis not present

## 2022-10-25 DIAGNOSIS — E1122 Type 2 diabetes mellitus with diabetic chronic kidney disease: Secondary | ICD-10-CM | POA: Diagnosis not present

## 2022-10-25 DIAGNOSIS — R0609 Other forms of dyspnea: Secondary | ICD-10-CM | POA: Diagnosis not present

## 2022-10-25 DIAGNOSIS — I110 Hypertensive heart disease with heart failure: Secondary | ICD-10-CM | POA: Diagnosis not present

## 2022-10-25 DIAGNOSIS — I255 Ischemic cardiomyopathy: Secondary | ICD-10-CM | POA: Diagnosis not present

## 2022-10-25 DIAGNOSIS — I252 Old myocardial infarction: Secondary | ICD-10-CM | POA: Diagnosis not present

## 2022-10-28 DIAGNOSIS — D519 Vitamin B12 deficiency anemia, unspecified: Secondary | ICD-10-CM | POA: Diagnosis not present

## 2022-10-28 DIAGNOSIS — I10 Essential (primary) hypertension: Secondary | ICD-10-CM | POA: Diagnosis not present

## 2022-10-28 DIAGNOSIS — E0821 Diabetes mellitus due to underlying condition with diabetic nephropathy: Secondary | ICD-10-CM | POA: Diagnosis not present

## 2022-10-28 DIAGNOSIS — I498 Other specified cardiac arrhythmias: Secondary | ICD-10-CM | POA: Diagnosis not present

## 2022-10-28 DIAGNOSIS — E785 Hyperlipidemia, unspecified: Secondary | ICD-10-CM | POA: Diagnosis not present

## 2022-10-28 DIAGNOSIS — N400 Enlarged prostate without lower urinary tract symptoms: Secondary | ICD-10-CM | POA: Diagnosis not present

## 2022-10-28 DIAGNOSIS — Z6827 Body mass index (BMI) 27.0-27.9, adult: Secondary | ICD-10-CM | POA: Diagnosis not present

## 2022-10-28 DIAGNOSIS — E559 Vitamin D deficiency, unspecified: Secondary | ICD-10-CM | POA: Diagnosis not present

## 2022-10-28 DIAGNOSIS — Z8679 Personal history of other diseases of the circulatory system: Secondary | ICD-10-CM | POA: Diagnosis not present

## 2022-10-28 DIAGNOSIS — Z79899 Other long term (current) drug therapy: Secondary | ICD-10-CM | POA: Diagnosis not present

## 2022-10-28 DIAGNOSIS — I2510 Atherosclerotic heart disease of native coronary artery without angina pectoris: Secondary | ICD-10-CM | POA: Diagnosis not present

## 2022-10-28 DIAGNOSIS — Z139 Encounter for screening, unspecified: Secondary | ICD-10-CM | POA: Diagnosis not present

## 2022-11-03 DIAGNOSIS — I5022 Chronic systolic (congestive) heart failure: Secondary | ICD-10-CM | POA: Diagnosis not present

## 2022-11-07 DIAGNOSIS — D649 Anemia, unspecified: Secondary | ICD-10-CM | POA: Diagnosis not present

## 2022-11-08 DIAGNOSIS — K219 Gastro-esophageal reflux disease without esophagitis: Secondary | ICD-10-CM | POA: Diagnosis not present

## 2022-11-08 DIAGNOSIS — Z8673 Personal history of transient ischemic attack (TIA), and cerebral infarction without residual deficits: Secondary | ICD-10-CM | POA: Diagnosis not present

## 2022-11-08 DIAGNOSIS — K625 Hemorrhage of anus and rectum: Secondary | ICD-10-CM | POA: Diagnosis not present

## 2022-11-08 DIAGNOSIS — D649 Anemia, unspecified: Secondary | ICD-10-CM | POA: Diagnosis not present

## 2022-11-08 DIAGNOSIS — I498 Other specified cardiac arrhythmias: Secondary | ICD-10-CM | POA: Diagnosis not present

## 2022-11-08 DIAGNOSIS — Z6827 Body mass index (BMI) 27.0-27.9, adult: Secondary | ICD-10-CM | POA: Diagnosis not present

## 2022-11-16 DIAGNOSIS — K625 Hemorrhage of anus and rectum: Secondary | ICD-10-CM | POA: Diagnosis not present

## 2022-11-16 DIAGNOSIS — Z8673 Personal history of transient ischemic attack (TIA), and cerebral infarction without residual deficits: Secondary | ICD-10-CM | POA: Diagnosis not present

## 2022-11-16 DIAGNOSIS — D649 Anemia, unspecified: Secondary | ICD-10-CM | POA: Diagnosis not present

## 2022-11-16 DIAGNOSIS — Z6827 Body mass index (BMI) 27.0-27.9, adult: Secondary | ICD-10-CM | POA: Diagnosis not present

## 2022-11-16 DIAGNOSIS — I498 Other specified cardiac arrhythmias: Secondary | ICD-10-CM | POA: Diagnosis not present

## 2022-11-17 DIAGNOSIS — R1013 Epigastric pain: Secondary | ICD-10-CM | POA: Diagnosis not present

## 2022-11-17 DIAGNOSIS — R109 Unspecified abdominal pain: Secondary | ICD-10-CM | POA: Diagnosis not present

## 2022-11-17 DIAGNOSIS — K5790 Diverticulosis of intestine, part unspecified, without perforation or abscess without bleeding: Secondary | ICD-10-CM | POA: Diagnosis not present

## 2022-11-17 DIAGNOSIS — D509 Iron deficiency anemia, unspecified: Secondary | ICD-10-CM | POA: Diagnosis not present

## 2022-11-18 DIAGNOSIS — I5022 Chronic systolic (congestive) heart failure: Secondary | ICD-10-CM | POA: Diagnosis not present

## 2022-11-22 DIAGNOSIS — R972 Elevated prostate specific antigen [PSA]: Secondary | ICD-10-CM | POA: Diagnosis not present

## 2022-11-22 DIAGNOSIS — N401 Enlarged prostate with lower urinary tract symptoms: Secondary | ICD-10-CM | POA: Diagnosis not present

## 2022-12-03 DIAGNOSIS — R0789 Other chest pain: Secondary | ICD-10-CM | POA: Diagnosis not present

## 2022-12-03 DIAGNOSIS — R918 Other nonspecific abnormal finding of lung field: Secondary | ICD-10-CM | POA: Diagnosis not present

## 2022-12-03 DIAGNOSIS — Z720 Tobacco use: Secondary | ICD-10-CM | POA: Diagnosis not present

## 2022-12-03 DIAGNOSIS — J449 Chronic obstructive pulmonary disease, unspecified: Secondary | ICD-10-CM | POA: Diagnosis not present

## 2022-12-03 DIAGNOSIS — I252 Old myocardial infarction: Secondary | ICD-10-CM | POA: Diagnosis not present

## 2022-12-03 DIAGNOSIS — Z79899 Other long term (current) drug therapy: Secondary | ICD-10-CM | POA: Diagnosis not present

## 2022-12-03 DIAGNOSIS — I110 Hypertensive heart disease with heart failure: Secondary | ICD-10-CM | POA: Diagnosis not present

## 2022-12-03 DIAGNOSIS — E119 Type 2 diabetes mellitus without complications: Secondary | ICD-10-CM | POA: Diagnosis not present

## 2022-12-03 DIAGNOSIS — I509 Heart failure, unspecified: Secondary | ICD-10-CM | POA: Diagnosis not present

## 2022-12-03 DIAGNOSIS — R079 Chest pain, unspecified: Secondary | ICD-10-CM | POA: Diagnosis not present

## 2022-12-03 DIAGNOSIS — I480 Paroxysmal atrial fibrillation: Secondary | ICD-10-CM | POA: Diagnosis not present

## 2022-12-03 DIAGNOSIS — Z7984 Long term (current) use of oral hypoglycemic drugs: Secondary | ICD-10-CM | POA: Diagnosis not present

## 2022-12-03 DIAGNOSIS — Z7901 Long term (current) use of anticoagulants: Secondary | ICD-10-CM | POA: Diagnosis not present

## 2022-12-03 DIAGNOSIS — R9431 Abnormal electrocardiogram [ECG] [EKG]: Secondary | ICD-10-CM | POA: Diagnosis not present

## 2022-12-04 DIAGNOSIS — I440 Atrioventricular block, first degree: Secondary | ICD-10-CM | POA: Diagnosis not present

## 2022-12-04 DIAGNOSIS — R9431 Abnormal electrocardiogram [ECG] [EKG]: Secondary | ICD-10-CM | POA: Diagnosis not present

## 2022-12-04 DIAGNOSIS — R079 Chest pain, unspecified: Secondary | ICD-10-CM | POA: Diagnosis not present

## 2022-12-06 DIAGNOSIS — I10 Essential (primary) hypertension: Secondary | ICD-10-CM | POA: Diagnosis not present

## 2022-12-06 DIAGNOSIS — I2510 Atherosclerotic heart disease of native coronary artery without angina pectoris: Secondary | ICD-10-CM | POA: Diagnosis not present

## 2022-12-06 DIAGNOSIS — Z7901 Long term (current) use of anticoagulants: Secondary | ICD-10-CM | POA: Diagnosis not present

## 2022-12-06 DIAGNOSIS — R0789 Other chest pain: Secondary | ICD-10-CM | POA: Diagnosis not present

## 2022-12-06 DIAGNOSIS — I480 Paroxysmal atrial fibrillation: Secondary | ICD-10-CM | POA: Diagnosis not present

## 2022-12-06 DIAGNOSIS — Z955 Presence of coronary angioplasty implant and graft: Secondary | ICD-10-CM | POA: Diagnosis not present

## 2022-12-06 DIAGNOSIS — I252 Old myocardial infarction: Secondary | ICD-10-CM | POA: Diagnosis not present

## 2022-12-06 DIAGNOSIS — I420 Dilated cardiomyopathy: Secondary | ICD-10-CM | POA: Diagnosis not present

## 2022-12-06 DIAGNOSIS — I4510 Unspecified right bundle-branch block: Secondary | ICD-10-CM | POA: Diagnosis not present

## 2022-12-06 DIAGNOSIS — R9439 Abnormal result of other cardiovascular function study: Secondary | ICD-10-CM | POA: Diagnosis not present

## 2022-12-06 DIAGNOSIS — I255 Ischemic cardiomyopathy: Secondary | ICD-10-CM | POA: Diagnosis not present

## 2022-12-07 DIAGNOSIS — Z8719 Personal history of other diseases of the digestive system: Secondary | ICD-10-CM | POA: Diagnosis not present

## 2022-12-07 DIAGNOSIS — Z09 Encounter for follow-up examination after completed treatment for conditions other than malignant neoplasm: Secondary | ICD-10-CM | POA: Diagnosis not present

## 2022-12-07 DIAGNOSIS — I2510 Atherosclerotic heart disease of native coronary artery without angina pectoris: Secondary | ICD-10-CM | POA: Diagnosis not present

## 2022-12-10 DIAGNOSIS — N183 Chronic kidney disease, stage 3 unspecified: Secondary | ICD-10-CM | POA: Diagnosis not present

## 2022-12-10 DIAGNOSIS — I214 Non-ST elevation (NSTEMI) myocardial infarction: Secondary | ICD-10-CM | POA: Diagnosis not present

## 2022-12-10 DIAGNOSIS — R0789 Other chest pain: Secondary | ICD-10-CM | POA: Diagnosis not present

## 2022-12-10 DIAGNOSIS — I340 Nonrheumatic mitral (valve) insufficiency: Secondary | ICD-10-CM | POA: Diagnosis not present

## 2022-12-10 DIAGNOSIS — Z8673 Personal history of transient ischemic attack (TIA), and cerebral infarction without residual deficits: Secondary | ICD-10-CM | POA: Diagnosis not present

## 2022-12-10 DIAGNOSIS — I130 Hypertensive heart and chronic kidney disease with heart failure and stage 1 through stage 4 chronic kidney disease, or unspecified chronic kidney disease: Secondary | ICD-10-CM | POA: Diagnosis not present

## 2022-12-10 DIAGNOSIS — F17290 Nicotine dependence, other tobacco product, uncomplicated: Secondary | ICD-10-CM | POA: Diagnosis not present

## 2022-12-10 DIAGNOSIS — R079 Chest pain, unspecified: Secondary | ICD-10-CM | POA: Diagnosis not present

## 2022-12-10 DIAGNOSIS — I440 Atrioventricular block, first degree: Secondary | ICD-10-CM | POA: Diagnosis not present

## 2022-12-10 DIAGNOSIS — Z955 Presence of coronary angioplasty implant and graft: Secondary | ICD-10-CM | POA: Diagnosis not present

## 2022-12-10 DIAGNOSIS — Z79899 Other long term (current) drug therapy: Secondary | ICD-10-CM | POA: Diagnosis not present

## 2022-12-10 DIAGNOSIS — I420 Dilated cardiomyopathy: Secondary | ICD-10-CM | POA: Diagnosis not present

## 2022-12-10 DIAGNOSIS — I255 Ischemic cardiomyopathy: Secondary | ICD-10-CM | POA: Diagnosis not present

## 2022-12-10 DIAGNOSIS — I959 Hypotension, unspecified: Secondary | ICD-10-CM | POA: Diagnosis not present

## 2022-12-10 DIAGNOSIS — I200 Unstable angina: Secondary | ICD-10-CM | POA: Diagnosis not present

## 2022-12-10 DIAGNOSIS — I480 Paroxysmal atrial fibrillation: Secondary | ICD-10-CM | POA: Diagnosis not present

## 2022-12-10 DIAGNOSIS — I252 Old myocardial infarction: Secondary | ICD-10-CM | POA: Diagnosis not present

## 2022-12-10 DIAGNOSIS — Z7982 Long term (current) use of aspirin: Secondary | ICD-10-CM | POA: Diagnosis not present

## 2022-12-10 DIAGNOSIS — I2510 Atherosclerotic heart disease of native coronary artery without angina pectoris: Secondary | ICD-10-CM | POA: Diagnosis not present

## 2022-12-10 DIAGNOSIS — I517 Cardiomegaly: Secondary | ICD-10-CM | POA: Diagnosis not present

## 2022-12-10 DIAGNOSIS — K219 Gastro-esophageal reflux disease without esophagitis: Secondary | ICD-10-CM | POA: Diagnosis not present

## 2022-12-10 DIAGNOSIS — Z7984 Long term (current) use of oral hypoglycemic drugs: Secondary | ICD-10-CM | POA: Diagnosis not present

## 2022-12-10 DIAGNOSIS — Z743 Need for continuous supervision: Secondary | ICD-10-CM | POA: Diagnosis not present

## 2022-12-10 DIAGNOSIS — I5022 Chronic systolic (congestive) heart failure: Secondary | ICD-10-CM | POA: Diagnosis not present

## 2022-12-10 DIAGNOSIS — R0989 Other specified symptoms and signs involving the circulatory and respiratory systems: Secondary | ICD-10-CM | POA: Diagnosis not present

## 2022-12-10 DIAGNOSIS — I110 Hypertensive heart disease with heart failure: Secondary | ICD-10-CM | POA: Diagnosis not present

## 2022-12-10 DIAGNOSIS — E1122 Type 2 diabetes mellitus with diabetic chronic kidney disease: Secondary | ICD-10-CM | POA: Diagnosis not present

## 2022-12-10 DIAGNOSIS — W19XXXA Unspecified fall, initial encounter: Secondary | ICD-10-CM | POA: Diagnosis not present

## 2022-12-10 DIAGNOSIS — E785 Hyperlipidemia, unspecified: Secondary | ICD-10-CM | POA: Diagnosis not present

## 2022-12-10 DIAGNOSIS — R58 Hemorrhage, not elsewhere classified: Secondary | ICD-10-CM | POA: Diagnosis not present

## 2022-12-10 DIAGNOSIS — Z7901 Long term (current) use of anticoagulants: Secondary | ICD-10-CM | POA: Diagnosis not present

## 2022-12-10 DIAGNOSIS — I25110 Atherosclerotic heart disease of native coronary artery with unstable angina pectoris: Secondary | ICD-10-CM | POA: Diagnosis not present

## 2022-12-10 DIAGNOSIS — J4489 Other specified chronic obstructive pulmonary disease: Secondary | ICD-10-CM | POA: Diagnosis not present

## 2022-12-11 DIAGNOSIS — R079 Chest pain, unspecified: Secondary | ICD-10-CM | POA: Diagnosis not present

## 2022-12-12 DIAGNOSIS — I340 Nonrheumatic mitral (valve) insufficiency: Secondary | ICD-10-CM | POA: Diagnosis not present

## 2022-12-12 DIAGNOSIS — I5022 Chronic systolic (congestive) heart failure: Secondary | ICD-10-CM | POA: Diagnosis not present

## 2022-12-12 DIAGNOSIS — R079 Chest pain, unspecified: Secondary | ICD-10-CM | POA: Diagnosis not present

## 2022-12-12 DIAGNOSIS — I517 Cardiomegaly: Secondary | ICD-10-CM | POA: Diagnosis not present

## 2022-12-12 DIAGNOSIS — I110 Hypertensive heart disease with heart failure: Secondary | ICD-10-CM | POA: Diagnosis not present

## 2022-12-12 DIAGNOSIS — Z7901 Long term (current) use of anticoagulants: Secondary | ICD-10-CM | POA: Diagnosis not present

## 2022-12-12 DIAGNOSIS — I255 Ischemic cardiomyopathy: Secondary | ICD-10-CM | POA: Diagnosis not present

## 2022-12-12 DIAGNOSIS — Z955 Presence of coronary angioplasty implant and graft: Secondary | ICD-10-CM | POA: Diagnosis not present

## 2022-12-12 DIAGNOSIS — I25110 Atherosclerotic heart disease of native coronary artery with unstable angina pectoris: Secondary | ICD-10-CM | POA: Diagnosis not present

## 2022-12-12 DIAGNOSIS — I480 Paroxysmal atrial fibrillation: Secondary | ICD-10-CM | POA: Diagnosis not present

## 2022-12-12 DIAGNOSIS — I214 Non-ST elevation (NSTEMI) myocardial infarction: Secondary | ICD-10-CM | POA: Diagnosis not present

## 2022-12-12 DIAGNOSIS — I420 Dilated cardiomyopathy: Secondary | ICD-10-CM | POA: Diagnosis not present

## 2022-12-12 DIAGNOSIS — I252 Old myocardial infarction: Secondary | ICD-10-CM | POA: Diagnosis not present

## 2022-12-13 DIAGNOSIS — I214 Non-ST elevation (NSTEMI) myocardial infarction: Secondary | ICD-10-CM | POA: Diagnosis not present

## 2022-12-13 DIAGNOSIS — I25110 Atherosclerotic heart disease of native coronary artery with unstable angina pectoris: Secondary | ICD-10-CM | POA: Diagnosis not present

## 2022-12-13 DIAGNOSIS — N183 Chronic kidney disease, stage 3 unspecified: Secondary | ICD-10-CM | POA: Diagnosis not present

## 2022-12-13 DIAGNOSIS — I130 Hypertensive heart and chronic kidney disease with heart failure and stage 1 through stage 4 chronic kidney disease, or unspecified chronic kidney disease: Secondary | ICD-10-CM | POA: Diagnosis not present

## 2022-12-13 DIAGNOSIS — I480 Paroxysmal atrial fibrillation: Secondary | ICD-10-CM | POA: Diagnosis not present

## 2022-12-13 DIAGNOSIS — I5022 Chronic systolic (congestive) heart failure: Secondary | ICD-10-CM | POA: Diagnosis not present

## 2022-12-13 DIAGNOSIS — E1122 Type 2 diabetes mellitus with diabetic chronic kidney disease: Secondary | ICD-10-CM | POA: Diagnosis not present

## 2022-12-13 DIAGNOSIS — Z7901 Long term (current) use of anticoagulants: Secondary | ICD-10-CM | POA: Diagnosis not present

## 2022-12-13 DIAGNOSIS — Z955 Presence of coronary angioplasty implant and graft: Secondary | ICD-10-CM | POA: Diagnosis not present

## 2022-12-14 DIAGNOSIS — R079 Chest pain, unspecified: Secondary | ICD-10-CM | POA: Diagnosis not present

## 2022-12-22 DIAGNOSIS — I951 Orthostatic hypotension: Secondary | ICD-10-CM | POA: Diagnosis not present

## 2022-12-22 DIAGNOSIS — M1990 Unspecified osteoarthritis, unspecified site: Secondary | ICD-10-CM | POA: Diagnosis not present

## 2022-12-22 DIAGNOSIS — E785 Hyperlipidemia, unspecified: Secondary | ICD-10-CM | POA: Diagnosis not present

## 2022-12-22 DIAGNOSIS — J4489 Other specified chronic obstructive pulmonary disease: Secondary | ICD-10-CM | POA: Diagnosis not present

## 2022-12-22 DIAGNOSIS — I4891 Unspecified atrial fibrillation: Secondary | ICD-10-CM | POA: Diagnosis not present

## 2022-12-22 DIAGNOSIS — N400 Enlarged prostate without lower urinary tract symptoms: Secondary | ICD-10-CM | POA: Diagnosis not present

## 2022-12-22 DIAGNOSIS — I252 Old myocardial infarction: Secondary | ICD-10-CM | POA: Diagnosis not present

## 2022-12-22 DIAGNOSIS — J309 Allergic rhinitis, unspecified: Secondary | ICD-10-CM | POA: Diagnosis not present

## 2022-12-22 DIAGNOSIS — K219 Gastro-esophageal reflux disease without esophagitis: Secondary | ICD-10-CM | POA: Diagnosis not present

## 2022-12-22 DIAGNOSIS — I255 Ischemic cardiomyopathy: Secondary | ICD-10-CM | POA: Diagnosis not present

## 2022-12-22 DIAGNOSIS — I471 Supraventricular tachycardia, unspecified: Secondary | ICD-10-CM | POA: Diagnosis not present

## 2022-12-22 DIAGNOSIS — Z008 Encounter for other general examination: Secondary | ICD-10-CM | POA: Diagnosis not present

## 2022-12-22 DIAGNOSIS — E1142 Type 2 diabetes mellitus with diabetic polyneuropathy: Secondary | ICD-10-CM | POA: Diagnosis not present

## 2022-12-27 ENCOUNTER — Ambulatory Visit (INDEPENDENT_AMBULATORY_CARE_PROVIDER_SITE_OTHER): Payer: Medicare HMO | Admitting: Podiatry

## 2022-12-27 DIAGNOSIS — M79674 Pain in right toe(s): Secondary | ICD-10-CM

## 2022-12-27 DIAGNOSIS — M79675 Pain in left toe(s): Secondary | ICD-10-CM

## 2022-12-27 DIAGNOSIS — B351 Tinea unguium: Secondary | ICD-10-CM | POA: Diagnosis not present

## 2022-12-27 DIAGNOSIS — E119 Type 2 diabetes mellitus without complications: Secondary | ICD-10-CM | POA: Diagnosis not present

## 2022-12-27 NOTE — Progress Notes (Signed)
  Subjective:  Patient ID: Samuel Green, male    DOB: [birth year],  MRN: [id number]  Chief Complaint  Patient presents with   Nail Problem    Diabetic Foot Care-nail trim     77 y.o. male presents with the above complaint. History confirmed with patient. Patient presenting with pain related to dystrophic thickened elongated nails. Patient is unable to trim own nails related to nail dystrophy and/or mobility issues. Patient does  have a history of T2DM.   Objective:  Physical Exam: warm, good capillary refill nail exam onychomycosis of the toenails, onycholysis, and dystrophic nails DP pulses palpable, PT pulses palpable, and protective sensation intact Left Foot:  Pain with palpation of nails due to elongation and dystrophic growth.  Right Foot: Pain with palpation of nails due to elongation and dystrophic growth.   Assessment:   1. Pain due to onychomycosis of toenails of both feet   2. Diabetes mellitus without complication (HCC)      Plan:  Patient was evaluated and treated and all questions answered.   #Onychomycosis with pain  -Nails palliatively debrided as below. -Educated on self-care  Procedure: Nail Debridement Rationale: Pain Type of Debridement: manual, sharp debridement. Instrumentation: Nail nipper, rotary burr. Number of Nails: 10  Return in about 3 months (around 03/28/2023) for [facility].         Corinna Gab, DPM [facility] / [facility]

## 2023-01-10 DIAGNOSIS — J449 Chronic obstructive pulmonary disease, unspecified: Secondary | ICD-10-CM | POA: Diagnosis not present

## 2023-01-10 DIAGNOSIS — R059 Cough, unspecified: Secondary | ICD-10-CM | POA: Diagnosis not present

## 2023-01-10 DIAGNOSIS — R0981 Nasal congestion: Secondary | ICD-10-CM | POA: Diagnosis not present

## 2023-01-19 DIAGNOSIS — N401 Enlarged prostate with lower urinary tract symptoms: Secondary | ICD-10-CM | POA: Diagnosis not present

## 2023-01-19 DIAGNOSIS — N390 Urinary tract infection, site not specified: Secondary | ICD-10-CM | POA: Diagnosis not present

## 2023-01-19 DIAGNOSIS — R339 Retention of urine, unspecified: Secondary | ICD-10-CM | POA: Diagnosis not present

## 2023-01-23 DIAGNOSIS — Z79899 Other long term (current) drug therapy: Secondary | ICD-10-CM | POA: Diagnosis not present

## 2023-01-23 DIAGNOSIS — Z955 Presence of coronary angioplasty implant and graft: Secondary | ICD-10-CM | POA: Diagnosis not present

## 2023-01-23 DIAGNOSIS — I5022 Chronic systolic (congestive) heart failure: Secondary | ICD-10-CM | POA: Diagnosis not present

## 2023-01-23 DIAGNOSIS — I2510 Atherosclerotic heart disease of native coronary artery without angina pectoris: Secondary | ICD-10-CM | POA: Diagnosis not present

## 2023-01-23 DIAGNOSIS — I480 Paroxysmal atrial fibrillation: Secondary | ICD-10-CM | POA: Diagnosis not present

## 2023-01-23 DIAGNOSIS — Z7901 Long term (current) use of anticoagulants: Secondary | ICD-10-CM | POA: Diagnosis not present

## 2023-01-23 DIAGNOSIS — I252 Old myocardial infarction: Secondary | ICD-10-CM | POA: Diagnosis not present

## 2023-01-23 DIAGNOSIS — Z8673 Personal history of transient ischemic attack (TIA), and cerebral infarction without residual deficits: Secondary | ICD-10-CM | POA: Diagnosis not present

## 2023-01-25 DIAGNOSIS — I10 Essential (primary) hypertension: Secondary | ICD-10-CM | POA: Diagnosis not present

## 2023-01-25 DIAGNOSIS — D649 Anemia, unspecified: Secondary | ICD-10-CM | POA: Diagnosis not present

## 2023-01-25 DIAGNOSIS — Z8679 Personal history of other diseases of the circulatory system: Secondary | ICD-10-CM | POA: Diagnosis not present

## 2023-01-25 DIAGNOSIS — I2510 Atherosclerotic heart disease of native coronary artery without angina pectoris: Secondary | ICD-10-CM | POA: Diagnosis not present

## 2023-01-25 DIAGNOSIS — I498 Other specified cardiac arrhythmias: Secondary | ICD-10-CM | POA: Diagnosis not present

## 2023-01-25 DIAGNOSIS — Z6827 Body mass index (BMI) 27.0-27.9, adult: Secondary | ICD-10-CM | POA: Diagnosis not present

## 2023-01-25 DIAGNOSIS — Z8673 Personal history of transient ischemic attack (TIA), and cerebral infarction without residual deficits: Secondary | ICD-10-CM | POA: Diagnosis not present

## 2023-01-25 DIAGNOSIS — E0821 Diabetes mellitus due to underlying condition with diabetic nephropathy: Secondary | ICD-10-CM | POA: Diagnosis not present

## 2023-01-25 DIAGNOSIS — E1140 Type 2 diabetes mellitus with diabetic neuropathy, unspecified: Secondary | ICD-10-CM | POA: Diagnosis not present

## 2023-01-25 DIAGNOSIS — E785 Hyperlipidemia, unspecified: Secondary | ICD-10-CM | POA: Diagnosis not present

## 2023-01-25 DIAGNOSIS — N400 Enlarged prostate without lower urinary tract symptoms: Secondary | ICD-10-CM | POA: Diagnosis not present

## 2023-01-25 DIAGNOSIS — K649 Unspecified hemorrhoids: Secondary | ICD-10-CM | POA: Diagnosis not present

## 2023-02-02 DIAGNOSIS — R339 Retention of urine, unspecified: Secondary | ICD-10-CM | POA: Diagnosis not present

## 2023-02-02 DIAGNOSIS — N401 Enlarged prostate with lower urinary tract symptoms: Secondary | ICD-10-CM | POA: Diagnosis not present

## 2023-02-02 DIAGNOSIS — N390 Urinary tract infection, site not specified: Secondary | ICD-10-CM | POA: Diagnosis not present

## 2023-02-06 DIAGNOSIS — I10 Essential (primary) hypertension: Secondary | ICD-10-CM | POA: Diagnosis not present

## 2023-02-06 DIAGNOSIS — I255 Ischemic cardiomyopathy: Secondary | ICD-10-CM | POA: Diagnosis not present

## 2023-02-06 DIAGNOSIS — I252 Old myocardial infarction: Secondary | ICD-10-CM | POA: Diagnosis not present

## 2023-02-06 DIAGNOSIS — I420 Dilated cardiomyopathy: Secondary | ICD-10-CM | POA: Diagnosis not present

## 2023-02-06 DIAGNOSIS — I2510 Atherosclerotic heart disease of native coronary artery without angina pectoris: Secondary | ICD-10-CM | POA: Diagnosis not present

## 2023-02-06 DIAGNOSIS — R0609 Other forms of dyspnea: Secondary | ICD-10-CM | POA: Diagnosis not present

## 2023-02-06 DIAGNOSIS — Z7901 Long term (current) use of anticoagulants: Secondary | ICD-10-CM | POA: Diagnosis not present

## 2023-02-06 DIAGNOSIS — E785 Hyperlipidemia, unspecified: Secondary | ICD-10-CM | POA: Diagnosis not present

## 2023-02-06 DIAGNOSIS — I214 Non-ST elevation (NSTEMI) myocardial infarction: Secondary | ICD-10-CM | POA: Diagnosis not present

## 2023-02-06 DIAGNOSIS — I480 Paroxysmal atrial fibrillation: Secondary | ICD-10-CM | POA: Diagnosis not present

## 2023-02-09 DIAGNOSIS — R339 Retention of urine, unspecified: Secondary | ICD-10-CM | POA: Diagnosis not present

## 2023-02-13 ENCOUNTER — Other Ambulatory Visit: Payer: Self-pay | Admitting: Family Medicine

## 2023-02-13 DIAGNOSIS — R972 Elevated prostate specific antigen [PSA]: Secondary | ICD-10-CM

## 2023-02-15 DIAGNOSIS — F320 Major depressive disorder, single episode, mild: Secondary | ICD-10-CM | POA: Diagnosis not present

## 2023-02-15 DIAGNOSIS — N400 Enlarged prostate without lower urinary tract symptoms: Secondary | ICD-10-CM | POA: Diagnosis not present

## 2023-02-15 DIAGNOSIS — K649 Unspecified hemorrhoids: Secondary | ICD-10-CM | POA: Diagnosis not present

## 2023-02-15 DIAGNOSIS — E1140 Type 2 diabetes mellitus with diabetic neuropathy, unspecified: Secondary | ICD-10-CM | POA: Diagnosis not present

## 2023-03-02 DIAGNOSIS — R339 Retention of urine, unspecified: Secondary | ICD-10-CM | POA: Diagnosis not present

## 2023-03-02 DIAGNOSIS — N401 Enlarged prostate with lower urinary tract symptoms: Secondary | ICD-10-CM | POA: Diagnosis not present

## 2023-03-13 DIAGNOSIS — I639 Cerebral infarction, unspecified: Secondary | ICD-10-CM | POA: Diagnosis not present

## 2023-03-13 DIAGNOSIS — D509 Iron deficiency anemia, unspecified: Secondary | ICD-10-CM | POA: Diagnosis not present

## 2023-03-13 DIAGNOSIS — I4891 Unspecified atrial fibrillation: Secondary | ICD-10-CM | POA: Diagnosis not present

## 2023-03-14 DIAGNOSIS — D509 Iron deficiency anemia, unspecified: Secondary | ICD-10-CM | POA: Diagnosis not present

## 2023-03-28 ENCOUNTER — Ambulatory Visit (INDEPENDENT_AMBULATORY_CARE_PROVIDER_SITE_OTHER): Payer: Medicare HMO | Admitting: Podiatry

## 2023-03-28 ENCOUNTER — Encounter: Payer: Self-pay | Admitting: Podiatry

## 2023-03-28 DIAGNOSIS — Z7901 Long term (current) use of anticoagulants: Secondary | ICD-10-CM

## 2023-03-28 DIAGNOSIS — M79675 Pain in left toe(s): Secondary | ICD-10-CM | POA: Diagnosis not present

## 2023-03-28 DIAGNOSIS — E119 Type 2 diabetes mellitus without complications: Secondary | ICD-10-CM

## 2023-03-28 DIAGNOSIS — B351 Tinea unguium: Secondary | ICD-10-CM | POA: Diagnosis not present

## 2023-03-28 DIAGNOSIS — M79674 Pain in right toe(s): Secondary | ICD-10-CM | POA: Diagnosis not present

## 2023-03-28 NOTE — Progress Notes (Signed)
  Subjective:  Patient ID: Samuel Green, male    DOB: 07-06-1945,  MRN: [id number]  Chief Complaint  Patient presents with   Foot Care    Last A1c was 7.0 he believes. Take Eliquis and Plavix. Needs nails trimmed.     77 y.o. male presents with the above complaint. History confirmed with patient. Patient presenting with pain related to dystrophic thickened elongated nails. Patient is unable to trim own nails related to nail dystrophy and/or mobility issues. Patient does  have a history of T2DM.  Patient is on chronic Eliquis and Plavix.  Objective:  Physical Exam: warm, good capillary refill nail exam onychomycosis of the toenails, onycholysis, and dystrophic nails DP pulses palpable, PT pulses palpable, and protective sensation intact Left Foot:  Pain with palpation of nails due to elongation and dystrophic growth.  Right Foot: Pain with palpation of nails due to elongation and dystrophic growth.   Assessment:   1. Pain due to onychomycosis of toenails of both feet   2. Diabetes mellitus without complication (HCC)   3. Current use of long term anticoagulation      Plan:  Patient was evaluated and treated and all questions answered.   #Onychomycosis with pain  -Nails palliatively debrided as below. -Educated on self-care  Procedure: Nail Debridement Rationale: Pain Type of Debridement: manual, sharp debridement. Instrumentation: Nail nipper, rotary burr. Number of Nails: 10  Return in about 3 months (around 06/26/2023) for Diabetic Foot Care.         Bronwen Betters, DPM [facility] / [facility]

## 2023-04-07 ENCOUNTER — Encounter: Payer: Self-pay | Admitting: Family Medicine

## 2023-04-10 DIAGNOSIS — Z7901 Long term (current) use of anticoagulants: Secondary | ICD-10-CM | POA: Diagnosis not present

## 2023-04-10 DIAGNOSIS — I252 Old myocardial infarction: Secondary | ICD-10-CM | POA: Diagnosis not present

## 2023-04-10 DIAGNOSIS — Z955 Presence of coronary angioplasty implant and graft: Secondary | ICD-10-CM | POA: Diagnosis not present

## 2023-04-10 DIAGNOSIS — I480 Paroxysmal atrial fibrillation: Secondary | ICD-10-CM | POA: Diagnosis not present

## 2023-04-10 DIAGNOSIS — I2510 Atherosclerotic heart disease of native coronary artery without angina pectoris: Secondary | ICD-10-CM | POA: Diagnosis not present

## 2023-04-10 DIAGNOSIS — Z79899 Other long term (current) drug therapy: Secondary | ICD-10-CM | POA: Diagnosis not present

## 2023-04-10 DIAGNOSIS — E785 Hyperlipidemia, unspecified: Secondary | ICD-10-CM | POA: Diagnosis not present

## 2023-04-10 DIAGNOSIS — I5022 Chronic systolic (congestive) heart failure: Secondary | ICD-10-CM | POA: Diagnosis not present

## 2023-04-10 DIAGNOSIS — I110 Hypertensive heart disease with heart failure: Secondary | ICD-10-CM | POA: Diagnosis not present

## 2023-04-13 ENCOUNTER — Ambulatory Visit
Admission: RE | Admit: 2023-04-13 | Discharge: 2023-04-13 | Disposition: A | Discharge status: Home / Self Care | Payer: Medicare HMO | Source: Ambulatory Visit | Attending: Family Medicine | Admitting: Family Medicine

## 2023-04-13 DIAGNOSIS — C61 Malignant neoplasm of prostate: Secondary | ICD-10-CM | POA: Diagnosis not present

## 2023-04-13 DIAGNOSIS — R972 Elevated prostate specific antigen [PSA]: Secondary | ICD-10-CM

## 2023-04-13 MED ORDER — GADOPICLENOL 0.5 MMOL/ML IV SOLN
9.0000 mL | Freq: Once | INTRAVENOUS | Status: AC | PRN
  Administered 2023-04-13: 9 mL via INTRAVENOUS

## 2023-04-14 ENCOUNTER — Telehealth (HOSPITAL_COMMUNITY): Payer: Self-pay | Admitting: Family Medicine

## 2023-04-14 ENCOUNTER — Encounter (HOSPITAL_COMMUNITY): Payer: Self-pay | Admitting: Family Medicine

## 2023-04-14 DIAGNOSIS — R1030 Lower abdominal pain, unspecified: Secondary | ICD-10-CM | POA: Diagnosis not present

## 2023-04-14 DIAGNOSIS — N401 Enlarged prostate with lower urinary tract symptoms: Secondary | ICD-10-CM | POA: Diagnosis not present

## 2023-04-14 DIAGNOSIS — R338 Other retention of urine: Secondary | ICD-10-CM | POA: Diagnosis not present

## 2023-04-18 DIAGNOSIS — F17220 Nicotine dependence, chewing tobacco, uncomplicated: Secondary | ICD-10-CM | POA: Diagnosis not present

## 2023-04-18 DIAGNOSIS — K5730 Diverticulosis of large intestine without perforation or abscess without bleeding: Secondary | ICD-10-CM | POA: Diagnosis not present

## 2023-04-18 DIAGNOSIS — I10 Essential (primary) hypertension: Secondary | ICD-10-CM | POA: Diagnosis not present

## 2023-04-18 DIAGNOSIS — Z955 Presence of coronary angioplasty implant and graft: Secondary | ICD-10-CM | POA: Diagnosis not present

## 2023-04-18 DIAGNOSIS — Z8673 Personal history of transient ischemic attack (TIA), and cerebral infarction without residual deficits: Secondary | ICD-10-CM | POA: Diagnosis not present

## 2023-04-18 DIAGNOSIS — Z7901 Long term (current) use of anticoagulants: Secondary | ICD-10-CM | POA: Diagnosis not present

## 2023-04-18 DIAGNOSIS — J449 Chronic obstructive pulmonary disease, unspecified: Secondary | ICD-10-CM | POA: Diagnosis not present

## 2023-04-18 DIAGNOSIS — I4891 Unspecified atrial fibrillation: Secondary | ICD-10-CM | POA: Diagnosis not present

## 2023-04-18 DIAGNOSIS — K449 Diaphragmatic hernia without obstruction or gangrene: Secondary | ICD-10-CM | POA: Diagnosis not present

## 2023-04-18 DIAGNOSIS — K219 Gastro-esophageal reflux disease without esophagitis: Secondary | ICD-10-CM | POA: Diagnosis not present

## 2023-04-18 DIAGNOSIS — K3189 Other diseases of stomach and duodenum: Secondary | ICD-10-CM | POA: Diagnosis not present

## 2023-04-18 DIAGNOSIS — Z7902 Long term (current) use of antithrombotics/antiplatelets: Secondary | ICD-10-CM | POA: Diagnosis not present

## 2023-04-18 DIAGNOSIS — Z885 Allergy status to narcotic agent status: Secondary | ICD-10-CM | POA: Diagnosis not present

## 2023-04-18 DIAGNOSIS — I252 Old myocardial infarction: Secondary | ICD-10-CM | POA: Diagnosis not present

## 2023-04-18 DIAGNOSIS — Z79899 Other long term (current) drug therapy: Secondary | ICD-10-CM | POA: Diagnosis not present

## 2023-04-18 DIAGNOSIS — Z8711 Personal history of peptic ulcer disease: Secondary | ICD-10-CM | POA: Diagnosis not present

## 2023-04-18 DIAGNOSIS — D509 Iron deficiency anemia, unspecified: Secondary | ICD-10-CM | POA: Diagnosis not present

## 2023-04-18 DIAGNOSIS — E119 Type 2 diabetes mellitus without complications: Secondary | ICD-10-CM | POA: Diagnosis not present

## 2023-04-18 DIAGNOSIS — Z860100 Personal history of colon polyps, unspecified: Secondary | ICD-10-CM | POA: Diagnosis not present

## 2023-04-18 DIAGNOSIS — K2970 Gastritis, unspecified, without bleeding: Secondary | ICD-10-CM | POA: Diagnosis not present

## 2023-04-20 NOTE — Telephone Encounter (Signed)
Additional note opened in error.

## 2023-04-24 DIAGNOSIS — R339 Retention of urine, unspecified: Secondary | ICD-10-CM | POA: Diagnosis not present

## 2023-04-24 DIAGNOSIS — H1030 Unspecified acute conjunctivitis, unspecified eye: Secondary | ICD-10-CM | POA: Diagnosis not present

## 2023-04-24 DIAGNOSIS — N400 Enlarged prostate without lower urinary tract symptoms: Secondary | ICD-10-CM | POA: Diagnosis not present

## 2023-04-24 DIAGNOSIS — K649 Unspecified hemorrhoids: Secondary | ICD-10-CM | POA: Diagnosis not present

## 2023-04-24 DIAGNOSIS — E559 Vitamin D deficiency, unspecified: Secondary | ICD-10-CM | POA: Diagnosis not present

## 2023-04-24 DIAGNOSIS — Z09 Encounter for follow-up examination after completed treatment for conditions other than malignant neoplasm: Secondary | ICD-10-CM | POA: Diagnosis not present

## 2023-04-24 DIAGNOSIS — E1140 Type 2 diabetes mellitus with diabetic neuropathy, unspecified: Secondary | ICD-10-CM | POA: Diagnosis not present

## 2023-04-26 DIAGNOSIS — R339 Retention of urine, unspecified: Secondary | ICD-10-CM | POA: Diagnosis not present

## 2023-04-26 DIAGNOSIS — J309 Allergic rhinitis, unspecified: Secondary | ICD-10-CM | POA: Diagnosis not present

## 2023-04-26 DIAGNOSIS — N401 Enlarged prostate with lower urinary tract symptoms: Secondary | ICD-10-CM | POA: Diagnosis not present

## 2023-05-01 DIAGNOSIS — I2510 Atherosclerotic heart disease of native coronary artery without angina pectoris: Secondary | ICD-10-CM | POA: Diagnosis not present

## 2023-05-01 DIAGNOSIS — I480 Paroxysmal atrial fibrillation: Secondary | ICD-10-CM | POA: Diagnosis not present

## 2023-05-02 DIAGNOSIS — I480 Paroxysmal atrial fibrillation: Secondary | ICD-10-CM | POA: Diagnosis not present

## 2023-05-02 DIAGNOSIS — I2510 Atherosclerotic heart disease of native coronary artery without angina pectoris: Secondary | ICD-10-CM | POA: Diagnosis not present

## 2023-05-03 DIAGNOSIS — I10 Essential (primary) hypertension: Secondary | ICD-10-CM | POA: Diagnosis not present

## 2023-05-03 DIAGNOSIS — F320 Major depressive disorder, single episode, mild: Secondary | ICD-10-CM | POA: Diagnosis not present

## 2023-05-03 DIAGNOSIS — Z8673 Personal history of transient ischemic attack (TIA), and cerebral infarction without residual deficits: Secondary | ICD-10-CM | POA: Diagnosis not present

## 2023-05-03 DIAGNOSIS — I2510 Atherosclerotic heart disease of native coronary artery without angina pectoris: Secondary | ICD-10-CM | POA: Diagnosis not present

## 2023-05-03 DIAGNOSIS — E1140 Type 2 diabetes mellitus with diabetic neuropathy, unspecified: Secondary | ICD-10-CM | POA: Diagnosis not present

## 2023-05-03 DIAGNOSIS — D649 Anemia, unspecified: Secondary | ICD-10-CM | POA: Diagnosis not present

## 2023-05-03 DIAGNOSIS — H1030 Unspecified acute conjunctivitis, unspecified eye: Secondary | ICD-10-CM | POA: Diagnosis not present

## 2023-05-03 DIAGNOSIS — I498 Other specified cardiac arrhythmias: Secondary | ICD-10-CM | POA: Diagnosis not present

## 2023-05-03 DIAGNOSIS — Z8679 Personal history of other diseases of the circulatory system: Secondary | ICD-10-CM | POA: Diagnosis not present

## 2023-05-03 DIAGNOSIS — E785 Hyperlipidemia, unspecified: Secondary | ICD-10-CM | POA: Diagnosis not present

## 2023-05-03 DIAGNOSIS — N400 Enlarged prostate without lower urinary tract symptoms: Secondary | ICD-10-CM | POA: Diagnosis not present

## 2023-05-03 DIAGNOSIS — K649 Unspecified hemorrhoids: Secondary | ICD-10-CM | POA: Diagnosis not present

## 2023-05-19 DIAGNOSIS — R339 Retention of urine, unspecified: Secondary | ICD-10-CM | POA: Diagnosis not present

## 2023-05-19 DIAGNOSIS — N401 Enlarged prostate with lower urinary tract symptoms: Secondary | ICD-10-CM | POA: Diagnosis not present

## 2023-06-06 DIAGNOSIS — I5022 Chronic systolic (congestive) heart failure: Secondary | ICD-10-CM | POA: Diagnosis not present

## 2023-06-06 DIAGNOSIS — I5020 Unspecified systolic (congestive) heart failure: Secondary | ICD-10-CM | POA: Diagnosis not present

## 2023-06-06 DIAGNOSIS — I080 Rheumatic disorders of both mitral and aortic valves: Secondary | ICD-10-CM | POA: Diagnosis not present

## 2023-06-19 DIAGNOSIS — I351 Nonrheumatic aortic (valve) insufficiency: Secondary | ICD-10-CM | POA: Diagnosis not present

## 2023-06-19 DIAGNOSIS — I5022 Chronic systolic (congestive) heart failure: Secondary | ICD-10-CM | POA: Diagnosis not present

## 2023-06-19 DIAGNOSIS — I110 Hypertensive heart disease with heart failure: Secondary | ICD-10-CM | POA: Diagnosis not present

## 2023-06-19 DIAGNOSIS — I255 Ischemic cardiomyopathy: Secondary | ICD-10-CM | POA: Diagnosis not present

## 2023-06-19 DIAGNOSIS — I2510 Atherosclerotic heart disease of native coronary artery without angina pectoris: Secondary | ICD-10-CM | POA: Diagnosis not present

## 2023-06-19 DIAGNOSIS — Z7901 Long term (current) use of anticoagulants: Secondary | ICD-10-CM | POA: Diagnosis not present

## 2023-06-19 DIAGNOSIS — I420 Dilated cardiomyopathy: Secondary | ICD-10-CM | POA: Diagnosis not present

## 2023-06-19 DIAGNOSIS — Z955 Presence of coronary angioplasty implant and graft: Secondary | ICD-10-CM | POA: Diagnosis not present

## 2023-06-19 DIAGNOSIS — I358 Other nonrheumatic aortic valve disorders: Secondary | ICD-10-CM | POA: Diagnosis not present

## 2023-06-19 DIAGNOSIS — E785 Hyperlipidemia, unspecified: Secondary | ICD-10-CM | POA: Diagnosis not present

## 2023-06-19 DIAGNOSIS — I480 Paroxysmal atrial fibrillation: Secondary | ICD-10-CM | POA: Diagnosis not present

## 2023-06-19 DIAGNOSIS — I252 Old myocardial infarction: Secondary | ICD-10-CM | POA: Diagnosis not present

## 2023-07-02 DIAGNOSIS — B029 Zoster without complications: Secondary | ICD-10-CM | POA: Diagnosis not present

## 2023-07-02 DIAGNOSIS — L03213 Periorbital cellulitis: Secondary | ICD-10-CM | POA: Diagnosis not present

## 2023-07-04 ENCOUNTER — Ambulatory Visit (INDEPENDENT_AMBULATORY_CARE_PROVIDER_SITE_OTHER): Payer: Medicare HMO | Admitting: Podiatry

## 2023-07-04 DIAGNOSIS — Z91199 Patient's noncompliance with other medical treatment and regimen due to unspecified reason: Secondary | ICD-10-CM

## 2023-07-12 DIAGNOSIS — D509 Iron deficiency anemia, unspecified: Secondary | ICD-10-CM | POA: Diagnosis not present

## 2023-07-17 DIAGNOSIS — K5790 Diverticulosis of intestine, part unspecified, without perforation or abscess without bleeding: Secondary | ICD-10-CM | POA: Diagnosis not present

## 2023-07-17 DIAGNOSIS — E611 Iron deficiency: Secondary | ICD-10-CM | POA: Diagnosis not present

## 2023-07-17 DIAGNOSIS — E538 Deficiency of other specified B group vitamins: Secondary | ICD-10-CM | POA: Diagnosis not present

## 2023-07-17 DIAGNOSIS — Z7901 Long term (current) use of anticoagulants: Secondary | ICD-10-CM | POA: Diagnosis not present

## 2023-07-17 DIAGNOSIS — D649 Anemia, unspecified: Secondary | ICD-10-CM | POA: Diagnosis not present

## 2023-07-24 ENCOUNTER — Encounter: Payer: Self-pay | Admitting: Podiatry

## 2023-07-24 ENCOUNTER — Ambulatory Visit (INDEPENDENT_AMBULATORY_CARE_PROVIDER_SITE_OTHER): Admitting: Podiatry

## 2023-07-24 DIAGNOSIS — M79675 Pain in left toe(s): Secondary | ICD-10-CM

## 2023-07-24 DIAGNOSIS — M79674 Pain in right toe(s): Secondary | ICD-10-CM | POA: Diagnosis not present

## 2023-07-24 DIAGNOSIS — B351 Tinea unguium: Secondary | ICD-10-CM

## 2023-07-24 DIAGNOSIS — Z7901 Long term (current) use of anticoagulants: Secondary | ICD-10-CM

## 2023-07-24 DIAGNOSIS — E119 Type 2 diabetes mellitus without complications: Secondary | ICD-10-CM

## 2023-07-24 NOTE — Progress Notes (Signed)
  Subjective:  Patient ID: Samuel Green, male    DOB: May 05, 1945,  MRN: [id number]  Chief Complaint  Patient presents with   [facility]    [facility] with out callous today. Last A1c was 7 month ago, it was 6.2 and takes Elliquis and Plaxix     78 y.o. male presents with the above complaint. History confirmed with patient. Patient presenting with pain related to dystrophic thickened elongated nails. Patient is unable to trim own nails related to nail dystrophy and/or mobility issues. Patient does  have a history of T2DM.  Patient is on chronic Eliquis and Plavix.  Objective:  Physical Exam: warm, good capillary refill, dry xerotic pedal skin nail exam onychomycosis of the toenails, onycholysis, and dystrophic nails DP pulses palpable, PT pulses palpable, and protective sensation intact Left Foot:  Pain with palpation of nails due to elongation and dystrophic growth.  Right Foot: Pain with palpation of nails due to elongation and dystrophic growth.   Assessment:   1. Pain due to onychomycosis of toenails of both feet   2. Diabetes mellitus without complication (HCC)   3. Current use of long term anticoagulation      Plan:  Patient was evaluated and treated and all questions answered.   #Onychomycosis with pain  -Nails palliatively debrided as below. -Educated on self-care -Coagulation defect due to chronic Eliquis and Plavix use  Procedure: Nail Debridement Rationale: Pain Type of Debridement: manual, sharp debridement. Instrumentation: Nail nipper, rotary burr. Number of Nails: 10  Patient educated on diabetes. Discussed proper diabetic foot care and discussed risks and complications of disease. Educated patient in depth on reasons to return to the office immediately should he/she discover anything concerning or new on the feet. All questions answered. Discussed proper shoes as well.    Return in about 3 months (around 10/23/2023) for Diabetic Foot Care.         Bronwen Betters,  DPM [facility] / [facility]

## 2023-08-02 ENCOUNTER — Other Ambulatory Visit: Payer: Self-pay | Admitting: Hematology and Oncology

## 2023-08-02 ENCOUNTER — Other Ambulatory Visit: Payer: Self-pay

## 2023-08-02 DIAGNOSIS — I2510 Atherosclerotic heart disease of native coronary artery without angina pectoris: Secondary | ICD-10-CM | POA: Diagnosis not present

## 2023-08-02 DIAGNOSIS — I10 Essential (primary) hypertension: Secondary | ICD-10-CM | POA: Diagnosis not present

## 2023-08-02 DIAGNOSIS — I498 Other specified cardiac arrhythmias: Secondary | ICD-10-CM | POA: Diagnosis not present

## 2023-08-02 DIAGNOSIS — F320 Major depressive disorder, single episode, mild: Secondary | ICD-10-CM | POA: Diagnosis not present

## 2023-08-02 DIAGNOSIS — E0821 Diabetes mellitus due to underlying condition with diabetic nephropathy: Secondary | ICD-10-CM | POA: Diagnosis not present

## 2023-08-02 DIAGNOSIS — D649 Anemia, unspecified: Secondary | ICD-10-CM | POA: Diagnosis not present

## 2023-08-02 DIAGNOSIS — N400 Enlarged prostate without lower urinary tract symptoms: Secondary | ICD-10-CM | POA: Diagnosis not present

## 2023-08-02 DIAGNOSIS — Z8679 Personal history of other diseases of the circulatory system: Secondary | ICD-10-CM | POA: Diagnosis not present

## 2023-08-02 DIAGNOSIS — E1140 Type 2 diabetes mellitus with diabetic neuropathy, unspecified: Secondary | ICD-10-CM | POA: Diagnosis not present

## 2023-08-02 DIAGNOSIS — E785 Hyperlipidemia, unspecified: Secondary | ICD-10-CM | POA: Diagnosis not present

## 2023-08-02 DIAGNOSIS — Z8673 Personal history of transient ischemic attack (TIA), and cerebral infarction without residual deficits: Secondary | ICD-10-CM | POA: Diagnosis not present

## 2023-08-02 DIAGNOSIS — H1030 Unspecified acute conjunctivitis, unspecified eye: Secondary | ICD-10-CM | POA: Diagnosis not present

## 2023-08-03 ENCOUNTER — Encounter: Payer: Self-pay | Admitting: Hematology and Oncology

## 2023-08-03 ENCOUNTER — Inpatient Hospital Stay: Attending: Hematology and Oncology | Admitting: Hematology and Oncology

## 2023-08-03 ENCOUNTER — Telehealth: Payer: Self-pay | Admitting: Hematology and Oncology

## 2023-08-03 ENCOUNTER — Other Ambulatory Visit: Payer: Self-pay

## 2023-08-03 ENCOUNTER — Inpatient Hospital Stay

## 2023-08-03 VITALS — BP 133/75 | HR 64 | Temp 97.9°F | Resp 18 | Ht 69.2 in | Wt 171.8 lb

## 2023-08-03 DIAGNOSIS — D649 Anemia, unspecified: Secondary | ICD-10-CM | POA: Diagnosis not present

## 2023-08-03 DIAGNOSIS — Z87891 Personal history of nicotine dependence: Secondary | ICD-10-CM | POA: Insufficient documentation

## 2023-08-03 LAB — CBC WITH DIFFERENTIAL (CANCER CENTER ONLY)
Abs Immature Granulocytes: 0.03 10*3/uL (ref 0.00–0.07)
Basophils Absolute: 0.1 10*3/uL (ref 0.0–0.1)
Basophils Relative: 1 %
Eosinophils Absolute: 0.3 10*3/uL (ref 0.0–0.5)
Eosinophils Relative: 4 %
HCT: 35.6 % — ABNORMAL LOW (ref 39.0–52.0)
Hemoglobin: 11.2 g/dL — ABNORMAL LOW (ref 13.0–17.0)
Immature Granulocytes: 0 %
Lymphocytes Relative: 14 %
Lymphs Abs: 1.1 10*3/uL (ref 0.7–4.0)
MCH: 29.3 pg (ref 26.0–34.0)
MCHC: 31.5 g/dL (ref 30.0–36.0)
MCV: 93.2 fL (ref 80.0–100.0)
Monocytes Absolute: 0.8 10*3/uL (ref 0.1–1.0)
Monocytes Relative: 10 %
Neutro Abs: 6 10*3/uL (ref 1.7–7.7)
Neutrophils Relative %: 71 %
Platelet Count: 264 10*3/uL (ref 150–400)
RBC: 3.82 MIL/uL — ABNORMAL LOW (ref 4.22–5.81)
RDW: 14.6 % (ref 11.5–15.5)
WBC Count: 8.3 10*3/uL (ref 4.0–10.5)
nRBC: 0 % (ref 0.0–0.2)
nRBC: 0 /100{WBCs}

## 2023-08-03 LAB — CMP (CANCER CENTER ONLY)
ALT: 18 U/L (ref 0–44)
AST: 26 U/L (ref 15–41)
Albumin: 4.1 g/dL (ref 3.5–5.0)
Alkaline Phosphatase: 100 U/L (ref 38–126)
Anion gap: 11 (ref 5–15)
BUN: 15 mg/dL (ref 8–23)
CO2: 23 mmol/L (ref 22–32)
Calcium: 9.2 mg/dL (ref 8.9–10.3)
Chloride: 105 mmol/L (ref 98–111)
Creatinine: 1.22 mg/dL (ref 0.61–1.24)
GFR, Estimated: >60 mL/min (ref >60)
Glucose, Bld: 125 mg/dL — ABNORMAL HIGH (ref 70–99)
Potassium: 4 mmol/L (ref 3.5–5.1)
Sodium: 139 mmol/L (ref 135–145)
Total Bilirubin: 0.8 mg/dL (ref 0.0–1.2)
Total Protein: 6.8 g/dL (ref 6.5–8.1)

## 2023-08-03 LAB — TECHNOLOGIST SMEAR REVIEW: Plt Morphology: NORMAL

## 2023-08-03 LAB — IRON AND TIBC
Iron: 54 ug/dL (ref 45–182)
Saturation Ratios: 15 % — ABNORMAL LOW (ref 17.9–39.5)
TIBC: 358 ug/dL (ref 250–450)
UIBC: 304 ug/dL

## 2023-08-03 LAB — VITAMIN B12: Vitamin B-12: 1325 pg/mL — ABNORMAL HIGH (ref 180–914)

## 2023-08-03 LAB — RETICULOCYTES
Immature Retic Fract: 13.8 % (ref 2.3–15.9)
RBC.: 3.73 MIL/uL — ABNORMAL LOW (ref 4.22–5.81)
Retic Count, Absolute: 66 10*3/uL (ref 19.0–186.0)
Retic Ct Pct: 1.8 % (ref 0.4–3.1)

## 2023-08-03 LAB — DIRECT ANTIGLOBULIN TEST (NOT AT ARMC)
DAT, IgG: NEGATIVE
DAT, complement: NEGATIVE

## 2023-08-03 LAB — LACTATE DEHYDROGENASE: LDH: 192 U/L (ref 98–192)

## 2023-08-03 LAB — FOLATE: Folate: 9.1 ng/mL (ref >5.9)

## 2023-08-03 LAB — FERRITIN: Ferritin: 87 ng/mL (ref 24–336)

## 2023-08-03 NOTE — Progress Notes (Signed)
 [facility] [address] [place],  [place]  [address] [phone]  Clinic Day:  08/03/2023   Referring physician: Micki Alas, MD  Patient Care Team: Patient Care Team: Tamela Fake, MD as PCP - General (Internal Medicine)   REASON FOR CONSULTATION:  Anemia  HISTORY OF PRESENT ILLNESS:  Samuel Green is a 78 y.o. male with a history of anemia who is referred in consultation by Dr. Micki Alas for assessment and management.  On chronic anticoagulation with both apixaban and clopidogrel.  Per Dr. Charolett Copes note, CBC in July 2024 revealed hemoglobin of 9.7 and MCV 81.  Serum iron 38, TIBC 361, iron saturation 10.5 and ferritin 16.1 at that time.  In November, 2024, hemoglobin was 12.8 with an MCV of 86, TIBC 322, serum iron 32 and iron saturation 9.9%.  EGD and colonoscopy in December 2024 revealed mild gastritis, hiatal hernia and diverticulosis. No site of active bleeding was identified.  Biopsy of the small intestine was benign.  In January 2025, B12 was greater than 2000.  Repeat CBC March 26 revealed persistent anemia with hemoglobin of 11 with and MCV 90, WBCs 6.9 with 73% neutrophils, 15% lymphocytes, 8% monocytes, 3% eosinophils, 1% basophils, platelets 214,000.  Serum iron was 70, TIBC 287 and iron saturation 24%.  Review of his records reveals anemia dating back to October 2021.     The patient reports significant fatigue and shortness of breath.  He denies pica to ice.  He denies any overt form of blood loss.  Intermittent nondrenching night sweats.  He has lost a significant amount of weight, this is attributed to Ozempic.  He states he has been on oral iron and B12 for some time now.  He states he is to remain on clopidogrel for a year after his recent stent placement in August 2024.  Past medical history: Paroxsymal atrial fibrillation, congestive heart failure, hypertension, artery disease diabetes, hyperlipidemia, asthma, chronic  kidney disease.  History of acute MI and TIAs.  Status post cardiac stent x 2, left lobectomy due to lung damage, tonsillectomy.  Social history: He is a former smoker, having quit 15 years ago.  He does chew tobacco.  He denies other substance use.  He is married with 3 grown children, 2 biologic.  He was born and raised in [place].  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  He previously was a Engineer, water and worked in Materials engineer.  Family history: No known history of hematologic or other malignancy.   REVIEW OF SYSTEMS:  Review of Systems  Constitutional:  Positive for diaphoresis (intermittent night sweats) and fatigue. Negative for appetite change, chills, fever and unexpected weight change.  HENT:   Negative for lump/mass, mouth sores, nosebleeds, sore throat and trouble swallowing.   Respiratory:  Positive for shortness of breath. Negative for cough and hemoptysis.   Cardiovascular:  Negative for chest pain and leg swelling.  Gastrointestinal:  Negative for abdominal pain, blood in stool, constipation, diarrhea, nausea and vomiting.  Genitourinary:  Negative for difficulty urinating, dysuria, frequency and hematuria.   Musculoskeletal:  Negative for arthralgias, back pain, gait problem, myalgias and neck pain.  Skin:  Negative for itching, rash and wound.  Neurological:  Negative for dizziness, extremity weakness, gait problem, headaches, light-headedness and numbness.  Hematological:  Negative for adenopathy.  Psychiatric/Behavioral:  Negative for depression and sleep disturbance. The patient is not nervous/anxious.      VITALS:  Blood pressure 133/75, pulse 64,  temperature 97.9 F (36.6 C), temperature source Oral, resp. rate 18, height 5' 9.2" (1.758 m), weight 171 lb 12.8 oz (77.9 kg), SpO2 99%.  Wt Readings from Last 3 Encounters:  08/03/23 171 lb 12.8 oz (77.9 kg)  01/10/17 209 lb 1.9 oz (94.9 kg)    Body mass index is 25.22  kg/m.  Performance status (ECOG): 1 - Symptomatic but completely ambulatory  PHYSICAL EXAM:  Physical Exam Vitals and nursing note reviewed.  Constitutional:      General: He is not in acute distress.    Appearance: Normal appearance. He is normal weight.  HENT:     Head: Normocephalic and atraumatic.     Mouth/Throat:     Mouth: Mucous membranes are moist.     Pharynx: Oropharynx is clear. No oropharyngeal exudate or posterior oropharyngeal erythema.  Eyes:     General: No scleral icterus.    Extraocular Movements: Extraocular movements intact.     Conjunctiva/sclera: Conjunctivae normal.     Pupils: Pupils are equal, round, and reactive to light.  Cardiovascular:     Rate and Rhythm: Normal rate and regular rhythm.     Heart sounds: Normal heart sounds. No murmur heard.    No friction rub. No gallop.  Pulmonary:     Effort: Pulmonary effort is normal.     Breath sounds: Examination of the left-lower field reveals decreased breath sounds. Decreased breath sounds present. No wheezing, rhonchi or rales.  Abdominal:     General: Bowel sounds are normal. There is no distension.     Palpations: Abdomen is soft. There is no hepatomegaly, splenomegaly or mass.     Tenderness: There is no abdominal tenderness.     Hernia: A hernia is present. Hernia is present in the umbilical area.  Musculoskeletal:        General: Normal range of motion.     Cervical back: Normal range of motion and neck supple. No tenderness.     Right lower leg: No edema.     Left lower leg: No edema.  Lymphadenopathy:     Cervical: No cervical adenopathy.     Upper Body:     Right upper body: No supraclavicular or axillary adenopathy.     Left upper body: No supraclavicular or axillary adenopathy.     Lower Body: No right inguinal adenopathy. No left inguinal adenopathy.  Skin:    General: Skin is warm and dry.     Coloration: Skin is not jaundiced.     Findings: No rash.  Neurological:     Mental  Status: He is alert and oriented to person, place, and time.     Cranial Nerves: No cranial nerve deficit.  Psychiatric:        Mood and Affect: Mood normal.        Behavior: Behavior normal.        Thought Content: Thought content normal.      LABS:      Latest Ref Rng & Units 08/03/2023   10:35 AM  CBC  WBC 4.0 - 10.5 K/uL 8.3   Hemoglobin 13.0 - 17.0 g/dL 82.9   Hematocrit 56.2 - 52.0 % 35.6   Platelets 150 - 400 K/uL 264     Latest Reference Range & Units 08/03/23 10:35  Neutrophils % 71  Lymphocytes % 14  Monocytes Relative % 10  Eosinophil % 4  Basophil % 1  Immature Granulocytes % 0  NEUT# 1.7 - 7.7 K/uL 6.0  Lymphs Abs 0.7 -  4.0 K/uL 1.1  Monocyte # 0.1 - 1.0 K/uL 0.8  Eosinophils Absolute 0.0 - 0.5 K/uL 0.3  Basophils Absolute 0.0 - 0.1 K/uL 0.1  Abs Immature Granulocytes 0.00 - 0.07 K/uL 0.03    08/03/23 10:36  RBC Morphology MORPHOLOGY UNREMARKABLE  WBC Morphology MORPHOLOGY UNREMARKABLE  Plt Morphology Normal platelet morphology       Latest Ref Rng & Units 08/03/2023   10:35 AM  CMP  Glucose 70 - 99 mg/dL 784   BUN 8 - 23 mg/dL 15   Creatinine 6.96 - 1.24 mg/dL 2.95   Sodium 284 - 132 mmol/L 139   Potassium 3.5 - 5.1 mmol/L 4.0   Chloride 98 - 111 mmol/L 105   CO2 22 - 32 mmol/L 23   Calcium 8.9 - 10.3 mg/dL 9.2   Total Protein 6.5 - 8.1 g/dL 6.8   Total Bilirubin 0.0 - 1.2 mg/dL 0.8   Alkaline Phos 38 - 126 U/L 100   AST 15 - 41 U/L 26   ALT 0 - 44 U/L 18     Lab Results  Component Value Date   TIBC 358 08/03/2023   FERRITIN 87 08/03/2023   IRONPCTSAT 15 (L) 08/03/2023   Lab Results  Component Value Date   LDH 192 08/03/2023    STUDIES:  No results found.    HISTORY:   Past Medical History:  Diagnosis Date   A-fib [facility])    Arrhythmia    Asthma    Atrial fibrillation (HCC)    COPD (chronic obstructive pulmonary disease) (HCC)    Diabetes (HCC)    Diverticulosis    Hx of colonic polyp    last colonoscopy recently    Hypertension    Kidney disease    MI (myocardial infarction) (HCC)    Mixed hyperlipidemia    PSA elevation    hx negative prostate bx   TIA (transient ischemic attack)     Past Surgical History:  Procedure Laterality Date   heart stent     LUNG LOBECTOMY Left    nn cancerous   TONSILLECTOMY AND ADENOIDECTOMY      Family History  Family history unknown: Yes    Social History:  reports that he quit smoking about 15 years ago. His smoking use included cigarettes. His smokeless tobacco use includes chew. He reports that he does not drink alcohol and does not use drugs.The patient is alone today.  Allergies:  Allergies  Allergen Reactions   Morphine     Other reaction(s): Other (See Comments) Angry, violent    Current Medications: Current Outpatient Medications  Medication Sig Dispense Refill   clopidogrel (PLAVIX) 75 MG tablet Take 75 mg by mouth daily.     DULoxetine (CYMBALTA) 60 MG capsule Take 60 mg by mouth daily.     ferrous sulfate 325 (65 FE) MG tablet Take 325 mg by mouth daily with breakfast.     ipratropium (ATROVENT HFA) 17 MCG/ACT inhaler Inhale 2 puffs into the lungs every 6 (six) hours as needed for wheezing.     magnesium oxide (MAG-OX) 400 (240 Mg) MG tablet Take 400 mg by mouth daily.     omega-3 acid ethyl esters (LOVAZA) 1 g capsule Take 1 g by mouth daily.     Albuterol  Sulfate 108 (90 Base) MCG/ACT AEPB Inhale 1-2 puffs into the lungs daily as needed.     amiodarone  (PACERONE ) 200 MG tablet Take 1 tablet by mouth 2 (two) times daily.     apixaban (ELIQUIS)  5 MG TABS tablet Take 5 mg by mouth 2 (two) times daily.     atorvastatin (LIPITOR) 80 MG tablet Take 80 mg by mouth daily.     bethanechol (URECHOLINE) 50 MG tablet Take 50 mg by mouth 2 (two) times daily.     buPROPion (WELLBUTRIN XL) 150 MG 24 hr tablet Take 150 mg by mouth daily.     cyanocobalamin  1000 MCG tablet Take by mouth.     ENTRESTO 24-26 MG Take 1 tablet by mouth 2 (two) times daily.      finasteride (PROSCAR) 5 MG tablet Take by mouth.     furosemide  (LASIX ) 40 MG tablet Take one tablet two times a week--may take additional for increased swelling or shortness of breath     glimepiride (AMARYL) 2 MG tablet Take 2 mg by mouth every morning. Patient taking 4 mg daily     ipratropium (ATROVENT) 0.02 % nebulizer solution Take 500 mcg by nebulization 4 (four) times daily as needed for Wheezing.     lisinopril-hydrochlorothiazide (PRINZIDE,ZESTORETIC) 20-25 MG tablet Take 1 tablet by mouth daily. (Patient not taking: Reported on 08/03/2023)     metFORMIN (GLUCOPHAGE) 1000 MG tablet Take 1,000 mg by mouth 2 (two) times daily with a meal.     metoprolol succinate (TOPROL-XL) 50 MG 24 hr tablet Take 50 mg by mouth daily.     ONETOUCH VERIO test strip SMARTSIG:Via Meter     OZEMPIC, 0.25 OR 0.5 MG/DOSE, 2 MG/3ML SOPN once a week.     pantoprazole (PROTONIX) 40 MG tablet Take 40 mg by mouth daily.     sitaGLIPtin (JANUVIA) 100 MG tablet Take 1 tablet by mouth at bedtime.     tamsulosin (FLOMAX) 0.4 MG CAPS capsule Take 0.4 mg by mouth daily.     torsemide (DEMADEX) 20 MG tablet Take 20 mg by mouth daily as needed.     Vitamin D, Ergocalciferol, (DRISDOL) 1.25 MG (50000 UNIT) CAPS capsule Take 50,000 Units by mouth once a week.     No current facility-administered medications for this visit.     ASSESSMENT & PLAN:   Assessment/Plan:  Samuel Green is a 78 y.o. male with normochromic, normocytic anemia.  This most likely represents anemia of chronic disease.  I will evaluate for other causes of anemia.  I will plan to see the patient back in 2 weeks to review the results.  I discussed the assessment and plan with the patient.  The patient was provided an opportunity to ask questions and all were answered.  The patient agreed with the plan and demonstrated an understanding of the instructions.    Thank you for the referral.    45 minutes was spent in patient care.  This included  time spent preparing to see the patient (e.g., review of tests), obtaining and/or reviewing separately obtained history, counseling and educating the patient/family/caregiver, ordering medications, tests, or procedures; documenting clinical information in the electronic or other health record, independently interpreting results and communicating results to the patient/family/caregiver as well as coordination of care.      Alfonso Ike, PA-C   Physician Assistant [facility] [phone]

## 2023-08-03 NOTE — Telephone Encounter (Signed)
 Patient has been scheduled for follow-up visit per 08/03/23 LOS.  Pt given an appt calendar with date and time.

## 2023-08-04 LAB — HAPTOGLOBIN: Haptoglobin: 135 mg/dL (ref 34–355)

## 2023-08-05 LAB — SOLUBLE TRANSFERRIN RECEPTOR: Transferrin Receptor: 24 nmol/L (ref 12.2–27.3)

## 2023-08-07 LAB — PROTEIN ELECTROPHORESIS, SERUM, WITH REFLEX
A/G Ratio: 1.2 (ref 0.7–1.7)
Albumin ELP: 3.6 g/dL (ref 2.9–4.4)
Alpha-1-Globulin: 0.2 g/dL (ref 0.0–0.4)
Alpha-2-Globulin: 0.9 g/dL (ref 0.4–1.0)
Beta Globulin: 1.1 g/dL (ref 0.7–1.3)
Gamma Globulin: 0.7 g/dL (ref 0.4–1.8)
Globulin, Total: 2.9 g/dL (ref 2.2–3.9)
Total Protein ELP: 6.5 g/dL (ref 6.0–8.5)

## 2023-08-09 NOTE — Progress Notes (Signed)
 Absent from appointment/late cancelation

## 2023-08-14 DIAGNOSIS — R339 Retention of urine, unspecified: Secondary | ICD-10-CM | POA: Diagnosis not present

## 2023-08-14 DIAGNOSIS — N401 Enlarged prostate with lower urinary tract symptoms: Secondary | ICD-10-CM | POA: Diagnosis not present

## 2023-08-14 DIAGNOSIS — Z125 Encounter for screening for malignant neoplasm of prostate: Secondary | ICD-10-CM | POA: Diagnosis not present

## 2023-08-17 ENCOUNTER — Inpatient Hospital Stay: Attending: Hematology and Oncology | Admitting: Hematology and Oncology

## 2023-08-17 ENCOUNTER — Encounter: Payer: Self-pay | Admitting: Hematology and Oncology

## 2023-08-17 VITALS — BP 134/70 | HR 65 | Temp 97.7°F | Resp 18 | Ht 69.2 in | Wt 170.0 lb

## 2023-08-17 DIAGNOSIS — D649 Anemia, unspecified: Secondary | ICD-10-CM | POA: Diagnosis present

## 2023-08-17 DIAGNOSIS — Z7901 Long term (current) use of anticoagulants: Secondary | ICD-10-CM | POA: Insufficient documentation

## 2023-08-17 NOTE — Progress Notes (Cosign Needed)
 [facility]  [address] [place],  [place]  [address] [phone]  Clinic Day:  08/17/2023  Referring physician: Tamela Fake, MD   HISTORY OF PRESENT ILLNESS:  The patient is a 78 y.o. male with chronic anemia that did not correct with iron and B12 replacement.  He is on chronic anticoagulation with both apixaban and clopidogrel. EGD and colonoscopy in December 2024 revealed mild gastritis, hiatal hernia and diverticulosis. No site of active bleeding was identified.  Biopsy of the small intestine was benign.  He is here today to review the results of his evaluation.  He continues to report significant fatigue.  He reports intermittent nondrenching night sweats.  He denies general pruritus, but has had itching of his scalp.  He has lost weight, but this is attributed to Ozempic.  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  PHYSICAL EXAM:  Blood pressure 134/70, pulse 65, temperature 97.7 F (36.5 C), temperature source Oral, resp. rate 18, height 5' 9.2" (1.758 m), weight 170 lb (77.1 kg), SpO2 100%. Wt Readings from Last 3 Encounters:  08/17/23 170 lb (77.1 kg)  08/03/23 171 lb 12.8 oz (77.9 kg)  01/10/17 209 lb 1.9 oz (94.9 kg)   Body mass index is 24.96 kg/m.  Performance status (ECOG): 1 - Symptomatic but completely ambulatory  Physical Exam Vitals and nursing note reviewed.  Constitutional:      General: He is not in acute distress.    Appearance: Normal appearance. He is normal weight.  HENT:     Head: Normocephalic and atraumatic.     Mouth/Throat:     Mouth: Mucous membranes are moist.     Pharynx: Oropharynx is clear. No oropharyngeal exudate or posterior oropharyngeal erythema.  Eyes:     General: No scleral icterus.    Extraocular Movements: Extraocular movements intact.     Conjunctiva/sclera: Conjunctivae normal.     Pupils: Pupils are equal, round, and reactive to light.   Cardiovascular:     Rate and Rhythm: Normal rate and regular rhythm.     Heart sounds: Normal heart sounds. No murmur heard.    No friction rub. No gallop.  Pulmonary:     Effort: Pulmonary effort is normal.     Breath sounds: Normal breath sounds. No wheezing, rhonchi or rales.  Abdominal:     General: Bowel sounds are normal. There is no distension.     Palpations: Abdomen is soft. There is no hepatomegaly, splenomegaly or mass.     Tenderness: There is no abdominal tenderness.  Musculoskeletal:        General: Normal range of motion.     Cervical back: Normal range of motion and neck supple. No tenderness.     Right lower leg: No edema.     Left lower leg: No edema.  Lymphadenopathy:     Cervical: No cervical adenopathy.     Upper Body:     Right upper body: No supraclavicular or axillary adenopathy.     Left upper body: No supraclavicular or axillary adenopathy.     Lower Body: No right inguinal adenopathy. No left inguinal adenopathy.  Skin:    General: Skin is warm and dry.     Coloration: Skin is not jaundiced.     Findings: No rash.  Neurological:     Mental Status: He is alert and oriented to person, place, and time.     Cranial Nerves: No cranial  nerve deficit.  Psychiatric:        Mood and Affect: Mood normal.        Behavior: Behavior normal.        Thought Content: Thought content normal.     LABS:      Latest Ref Rng & Units 08/03/2023   10:35 AM  CBC  WBC 4.0 - 10.5 K/uL 8.3   Hemoglobin 13.0 - 17.0 g/dL 78.2   Hematocrit 95.6 - 52.0 % 35.6   Platelets 150 - 400 K/uL 264     Latest Reference Range & Units 08/03/23 10:35  Neutrophils % 71  Lymphocytes % 14  Monocytes Relative % 10  Eosinophil % 4  Basophil % 1  Immature Granulocytes % 0  NEUT# 1.7 - 7.7 K/uL 6.0  Lymphs Abs 0.7 - 4.0 K/uL 1.1  Monocyte # 0.1 - 1.0 K/uL 0.8  Eosinophils Absolute 0.0 - 0.5 K/uL 0.3  Basophils Absolute 0.0 - 0.1 K/uL 0.1  Abs Immature Granulocytes 0.00 - 0.07  K/uL 0.03    08/03/23 10:36  RBC Morphology MORPHOLOGY UNREMARKABLE  WBC Morphology MORPHOLOGY UNREMARKABLE  Plt Morphology Normal platelet morphology      Latest Ref Rng & Units 08/03/2023   10:35 AM  CMP  Glucose 70 - 99 mg/dL 213   BUN 8 - 23 mg/dL 15   Creatinine 0.86 - 1.24 mg/dL 5.78   Sodium 469 - 629 mmol/L 139   Potassium 3.5 - 5.1 mmol/L 4.0   Chloride 98 - 111 mmol/L 105   CO2 22 - 32 mmol/L 23   Calcium 8.9 - 10.3 mg/dL 9.2   Total Protein 6.5 - 8.1 g/dL 6.8   Total Bilirubin 0.0 - 1.2 mg/dL 0.8   Alkaline Phos 38 - 126 U/L 100   AST 15 - 41 U/L 26   ALT 0 - 44 U/L 18     Lab Results  Component Value Date   TOTALPROTELP 6.5 08/03/2023   ALBUMINELP 3.6 08/03/2023   A1GS 0.2 08/03/2023   A2GS 0.9 08/03/2023   BETS 1.1 08/03/2023   GAMS 0.7 08/03/2023   MSPIKE Not Observed 08/03/2023   Lab Results  Component Value Date   TIBC 358 08/03/2023   FERRITIN 87 08/03/2023   IRONPCTSAT 15 (L) 08/03/2023   Lab Results  Component Value Date   LDH 192 08/03/2023       Component Value Date/Time   TOTALPROTELP 6.5 08/03/2023 1035   ALBUMINELP 3.6 08/03/2023 1035   A1GS 0.2 08/03/2023 1035   A2GS 0.9 08/03/2023 1035   BETS 1.1 08/03/2023 1035   GAMS 0.7 08/03/2023 1035   MSPIKE Not Observed 08/03/2023 1035   LDH 192 08/03/2023 1036    Review Flowsheet       Latest Ref Rng & Units 08/03/2023  Oncology Labs  Ferritin 24 - 336 ng/mL 87   %SAT 17.9 - 39.5 % 15   Total Protein ELP 6.0 - 8.5 g/dL 6.5   Albumin ELP 2.9 - 4.4 g/dL 3.6   Alpha-1 Globulin 0.0 - 0.4 g/dL 0.2   Alpha-2 Globulin 0.4 - 1.0 g/dL 0.9   Beta Globulin 0.7 - 1.3 g/dL 1.1   Gamma Globulin 0.4 - 1.8 g/dL 0.7   M-Spike, % Not Observed g/dL Not Observed   LDH 98 - 192 U/L 192      STUDIES:  No results found.    ASSESSMENT & PLAN:   Assessment/Plan:  78 y.o. male with mild chronic anemia, most likely anemia of  chronic disease, but could also represent early myelodysplasia.   Evaluation did not elicit another specific etiology.  There was no vitamin deficiency.  There was no, evidence of hemolysis or multiple myeloma.  There was nothing concerning per his peripheral smear.  If his hemoglobin drops below 10, we would consider bone marrow biopsy.  As long as his hemoglobin remains above 10, erythropoietin stimulating agents are not recommended.  The patient understands all the plans discussed today and is in agreement with them.  He knows to contact our office if he develops concerns prior to his next appointment.     Alfonso Ike, PA-C   Physician Assistant [facility] [phone]

## 2023-08-29 DIAGNOSIS — N401 Enlarged prostate with lower urinary tract symptoms: Secondary | ICD-10-CM | POA: Diagnosis not present

## 2023-08-29 DIAGNOSIS — R339 Retention of urine, unspecified: Secondary | ICD-10-CM | POA: Diagnosis not present

## 2023-09-12 DIAGNOSIS — Z0000 Encounter for general adult medical examination without abnormal findings: Secondary | ICD-10-CM | POA: Diagnosis not present

## 2023-09-12 DIAGNOSIS — Z9181 History of falling: Secondary | ICD-10-CM | POA: Diagnosis not present

## 2023-09-27 DIAGNOSIS — N401 Enlarged prostate with lower urinary tract symptoms: Secondary | ICD-10-CM | POA: Diagnosis not present

## 2023-09-27 DIAGNOSIS — R339 Retention of urine, unspecified: Secondary | ICD-10-CM | POA: Diagnosis not present

## 2023-09-27 DIAGNOSIS — N390 Urinary tract infection, site not specified: Secondary | ICD-10-CM | POA: Diagnosis not present

## 2023-10-16 DIAGNOSIS — R0602 Shortness of breath: Secondary | ICD-10-CM | POA: Diagnosis not present

## 2023-10-23 ENCOUNTER — Ambulatory Visit (INDEPENDENT_AMBULATORY_CARE_PROVIDER_SITE_OTHER): Admitting: Podiatry

## 2023-10-23 DIAGNOSIS — B351 Tinea unguium: Secondary | ICD-10-CM | POA: Diagnosis not present

## 2023-10-23 DIAGNOSIS — M79674 Pain in right toe(s): Secondary | ICD-10-CM

## 2023-10-23 DIAGNOSIS — M79675 Pain in left toe(s): Secondary | ICD-10-CM | POA: Diagnosis not present

## 2023-10-23 DIAGNOSIS — Z7901 Long term (current) use of anticoagulants: Secondary | ICD-10-CM

## 2023-10-23 DIAGNOSIS — E119 Type 2 diabetes mellitus without complications: Secondary | ICD-10-CM

## 2023-10-23 NOTE — Progress Notes (Unsigned)
  Subjective:  Patient ID: Samuel Green, male    DOB: April 24, 1945,  MRN: [id number]  Chief Complaint  Patient presents with   [facility]    [facility] with out callous, last A1c was 6.1 last Aug. Plavix and Eliquis.     78 y.o. male presents with the above complaint. History confirmed with patient. Patient presenting with pain related to dystrophic thickened elongated nails. Patient is unable to trim own nails related to nail dystrophy and/or mobility issues. Patient does have a history of T2DM.  Last A1c approximately 6.1.  Patient is on chronic Eliquis and Plavix.  Objective:  Physical Exam: warm, good capillary refill, dry xerotic pedal skin nail exam onychomycosis of the toenails, onycholysis, and dystrophic nails DP pulses palpable, PT pulses palpable, and protective sensation intact Left Foot:  Pain with palpation of nails due to elongation and dystrophic growth.  Right Foot: Pain with palpation of nails due to elongation and dystrophic growth.   Assessment:   1. Pain due to onychomycosis of toenails of both feet   2. Diabetes mellitus without complication (HCC)   3. Current use of long term anticoagulation      Plan:  Patient was evaluated and treated and all questions answered.   #Onychomycosis with pain  -Nails palliatively debrided as below. -Educated on self-care -Coagulation defect due to chronic Eliquis and Plavix use  Procedure: Nail Debridement Rationale: Pain Type of Debridement: manual, sharp debridement. Instrumentation: Nail nipper, rotary burr. Number of Nails: 10  Patient educated on diabetes. Discussed proper diabetic foot care and discussed risks and complications of disease. Educated patient in depth on reasons to return to the office immediately should he/she discover anything concerning or new on the feet. All questions answered. Discussed proper shoes as well.    Return in about 3 months (around 01/23/2024) for Diabetic Foot Care.         Ethan Saddler,  DPM [facility] / [facility]

## 2023-11-07 DIAGNOSIS — N390 Urinary tract infection, site not specified: Secondary | ICD-10-CM | POA: Diagnosis not present

## 2023-11-07 DIAGNOSIS — N401 Enlarged prostate with lower urinary tract symptoms: Secondary | ICD-10-CM | POA: Diagnosis not present

## 2023-11-07 DIAGNOSIS — R339 Retention of urine, unspecified: Secondary | ICD-10-CM | POA: Diagnosis not present

## 2023-11-15 DIAGNOSIS — I509 Heart failure, unspecified: Secondary | ICD-10-CM | POA: Diagnosis not present

## 2023-11-15 DIAGNOSIS — E1142 Type 2 diabetes mellitus with diabetic polyneuropathy: Secondary | ICD-10-CM | POA: Diagnosis not present

## 2023-11-15 DIAGNOSIS — Z862 Personal history of diseases of the blood and blood-forming organs and certain disorders involving the immune mechanism: Secondary | ICD-10-CM | POA: Diagnosis not present

## 2023-11-15 DIAGNOSIS — R8290 Unspecified abnormal findings in urine: Secondary | ICD-10-CM | POA: Diagnosis not present

## 2023-11-15 DIAGNOSIS — E663 Overweight: Secondary | ICD-10-CM | POA: Diagnosis not present

## 2023-11-15 DIAGNOSIS — I2510 Atherosclerotic heart disease of native coronary artery without angina pectoris: Secondary | ICD-10-CM | POA: Diagnosis not present

## 2023-11-15 DIAGNOSIS — J449 Chronic obstructive pulmonary disease, unspecified: Secondary | ICD-10-CM | POA: Diagnosis not present

## 2023-11-15 DIAGNOSIS — Z6825 Body mass index (BMI) 25.0-25.9, adult: Secondary | ICD-10-CM | POA: Diagnosis not present

## 2023-11-15 DIAGNOSIS — R0602 Shortness of breath: Secondary | ICD-10-CM | POA: Diagnosis not present

## 2023-11-15 DIAGNOSIS — Z720 Tobacco use: Secondary | ICD-10-CM | POA: Diagnosis not present

## 2023-11-16 ENCOUNTER — Other Ambulatory Visit: Payer: Self-pay | Admitting: Hematology and Oncology

## 2023-11-16 DIAGNOSIS — D649 Anemia, unspecified: Secondary | ICD-10-CM

## 2023-11-17 ENCOUNTER — Encounter: Payer: Self-pay | Admitting: Hematology and Oncology

## 2023-11-17 ENCOUNTER — Inpatient Hospital Stay

## 2023-11-17 ENCOUNTER — Other Ambulatory Visit

## 2023-11-17 ENCOUNTER — Telehealth: Payer: Self-pay | Admitting: Hematology and Oncology

## 2023-11-17 ENCOUNTER — Inpatient Hospital Stay: Attending: Hematology and Oncology | Admitting: Hematology and Oncology

## 2023-11-17 ENCOUNTER — Telehealth: Payer: Self-pay

## 2023-11-17 VITALS — BP 141/76 | HR 62 | Temp 97.6°F | Resp 20 | Ht 69.2 in | Wt 174.6 lb

## 2023-11-17 DIAGNOSIS — J984 Other disorders of lung: Secondary | ICD-10-CM | POA: Diagnosis not present

## 2023-11-17 DIAGNOSIS — D649 Anemia, unspecified: Secondary | ICD-10-CM | POA: Insufficient documentation

## 2023-11-17 DIAGNOSIS — D638 Anemia in other chronic diseases classified elsewhere: Secondary | ICD-10-CM | POA: Diagnosis not present

## 2023-11-17 LAB — CMP (CANCER CENTER ONLY)
ALT: 37 U/L (ref 0–44)
AST: 33 U/L (ref 15–41)
Albumin: 3.7 g/dL (ref 3.5–5.0)
Alkaline Phosphatase: 166 U/L — ABNORMAL HIGH (ref 38–126)
Anion gap: 14 (ref 5–15)
BUN: 23 mg/dL (ref 8–23)
CO2: 20 mmol/L — ABNORMAL LOW (ref 22–32)
Calcium: 9.2 mg/dL (ref 8.9–10.3)
Chloride: 108 mmol/L (ref 98–111)
Creatinine: 1.43 mg/dL — ABNORMAL HIGH (ref 0.61–1.24)
GFR, Estimated: 51 mL/min — ABNORMAL LOW (ref >60)
Glucose, Bld: 133 mg/dL — ABNORMAL HIGH (ref 70–99)
Potassium: 4.1 mmol/L (ref 3.5–5.1)
Sodium: 142 mmol/L (ref 135–145)
Total Bilirubin: 0.5 mg/dL (ref 0.0–1.2)
Total Protein: 6.4 g/dL — ABNORMAL LOW (ref 6.5–8.1)

## 2023-11-17 LAB — IRON AND TIBC
Iron: 48 ug/dL (ref 45–182)
Saturation Ratios: 14 % — ABNORMAL LOW (ref 17.9–39.5)
TIBC: 356 ug/dL (ref 250–450)
UIBC: 308 ug/dL

## 2023-11-17 LAB — CBC WITH DIFFERENTIAL (CANCER CENTER ONLY)
Abs Immature Granulocytes: 0.02 K/uL (ref 0.00–0.07)
Basophils Absolute: 0 K/uL (ref 0.0–0.1)
Basophils Relative: 1 %
Eosinophils Absolute: 0.2 K/uL (ref 0.0–0.5)
Eosinophils Relative: 4 %
HCT: 35.7 % — ABNORMAL LOW (ref 39.0–52.0)
Hemoglobin: 11.1 g/dL — ABNORMAL LOW (ref 13.0–17.0)
Immature Granulocytes: 0 %
Lymphocytes Relative: 16 %
Lymphs Abs: 0.8 K/uL (ref 0.7–4.0)
MCH: 27.2 pg (ref 26.0–34.0)
MCHC: 31.1 g/dL (ref 30.0–36.0)
MCV: 87.5 fL (ref 80.0–100.0)
Monocytes Absolute: 0.5 K/uL (ref 0.1–1.0)
Monocytes Relative: 9 %
Neutro Abs: 3.6 K/uL (ref 1.7–7.7)
Neutrophils Relative %: 70 %
Platelet Count: 181 K/uL (ref 150–400)
RBC: 4.08 MIL/uL — ABNORMAL LOW (ref 4.22–5.81)
RDW: 14 % (ref 11.5–15.5)
WBC Count: 5.2 K/uL (ref 4.0–10.5)
nRBC: 0 % (ref 0.0–0.2)

## 2023-11-17 LAB — FERRITIN: Ferritin: 40 ng/mL (ref 24–336)

## 2023-11-17 NOTE — Telephone Encounter (Signed)
-----   Message from Andrez DELENA Foy sent at 11/17/2023 12:41 PM EDT ----- Please let him know I looked at the chest x-ray from [facility].  There is some fluid retention in the lungs.  Have him contact his primary care if they have not already discussed this with.  Thank you

## 2023-11-17 NOTE — Progress Notes (Cosign Needed)
 [facility]  [address] [place],  [place]  [address] [phone]  Clinic Day:  11/17/2023  Referring physician: Gable Cambric, MD   HISTORY OF PRESENT ILLNESS:  The patient is a 78 y.o. male with mild anemia felt to be most likely anemia of chronic disease.  Evaluation did not elicit a specific etiology.  He is here today for repeat clinical assessment.  He denies progressive fatigue concerning for worsening anemia.  He denies any overt form of blood loss.  He reports shortness of breath from his known chronic lung disease.  Recent chest x-ray at [facility] revealed stable chronic interstitial changes bilaterally with interstitial edema.  VITALS:   Blood pressure (!) 141/76, pulse 62, temperature 97.6 F (36.4 C), temperature source Oral, resp. rate 20, height 5' 9.2 (1.758 m), weight 174 lb 9.6 oz (79.2 kg), SpO2 100%. Wt Readings from Last 3 Encounters:  11/17/23 174 lb 9.6 oz (79.2 kg)  08/17/23 170 lb (77.1 kg)  08/03/23 171 lb 12.8 oz (77.9 kg)   Body mass index is 25.64 kg/m.  Performance status (ECOG): 1 - Symptomatic but completely ambulatory  PHYSICAL EXAM:   Physical Exam Vitals and nursing note reviewed.  Constitutional:      General: He is not in acute distress.    Appearance: Normal appearance. He is normal weight. He is not ill-appearing.  HENT:     Head: Normocephalic and atraumatic.     Mouth/Throat:     Mouth: Mucous membranes are moist.     Pharynx: Oropharynx is clear. No oropharyngeal exudate or posterior oropharyngeal erythema.  Eyes:     General: No scleral icterus.    Extraocular Movements: Extraocular movements intact.     Conjunctiva/sclera: Conjunctivae normal.     Pupils: Pupils are equal, round, and reactive to light.  Cardiovascular:     Rate and Rhythm: Normal rate and regular rhythm.     Heart sounds: Normal heart sounds. No murmur heard.    No friction rub. No gallop.  Pulmonary:     Effort: Pulmonary effort  is normal.     Breath sounds: Decreased breath sounds (Throughout) present. No wheezing, rhonchi or rales.  Abdominal:     General: Bowel sounds are normal. There is no distension.     Palpations: Abdomen is soft. There is no hepatomegaly, splenomegaly or mass.     Tenderness: There is no abdominal tenderness.  Musculoskeletal:        General: Normal range of motion.     Cervical back: Normal range of motion and neck supple. No tenderness.     Right lower leg: No edema.     Left lower leg: No edema.  Lymphadenopathy:     Cervical: No cervical adenopathy.     Upper Body:     Right upper body: No supraclavicular or axillary adenopathy.     Left upper body: No supraclavicular or axillary adenopathy.     Lower Body: No right inguinal adenopathy. No left inguinal adenopathy.  Skin:    General: Skin is warm and dry.     Coloration: Skin is not jaundiced.     Findings: No rash.  Neurological:     Mental Status: He is alert and oriented to person, place, and time.     Cranial Nerves: No cranial nerve deficit.  Psychiatric:        Mood and Affect: Mood normal.        Behavior: Behavior normal.  Thought Content: Thought content normal.      LABS:      Latest Ref Rng & Units 11/17/2023   11:19 AM 08/03/2023   10:35 AM  CBC  WBC 4.0 - 10.5 K/uL 5.2  8.3   Hemoglobin 13.0 - 17.0 g/dL 88.8  88.7   Hematocrit 39.0 - 52.0 % 35.7  35.6   Platelets 150 - 400 K/uL 181  264       Latest Ref Rng & Units 11/17/2023   11:19 AM 08/03/2023   10:35 AM  CMP  Glucose 70 - 99 mg/dL 866  874   BUN 8 - 23 mg/dL 23  15   Creatinine 9.38 - 1.24 mg/dL 8.56  8.77   Sodium 864 - 145 mmol/L 142  139   Potassium 3.5 - 5.1 mmol/L 4.1  4.0   Chloride 98 - 111 mmol/L 108  105   CO2 22 - 32 mmol/L 20  23   Calcium 8.9 - 10.3 mg/dL 9.2  9.2   Total Protein 6.5 - 8.1 g/dL 6.4  6.8   Total Bilirubin 0.0 - 1.2 mg/dL 0.5  0.8   Alkaline Phos 38 - 126 U/L 166  100   AST 15 - 41 U/L 33  26   ALT 0 - 44  U/L 37  18      No results found for: CEA1, CEA / No results found for: CEA1, CEA No results found for: PSA1 No results found for: CAN199 No results found for: RJW874  Lab Results  Component Value Date   TOTALPROTELP 6.5 08/03/2023   ALBUMINELP 3.6 08/03/2023   A1GS 0.2 08/03/2023   A2GS 0.9 08/03/2023   BETS 1.1 08/03/2023   GAMS 0.7 08/03/2023   MSPIKE Not Observed 08/03/2023   Lab Results  Component Value Date   TIBC 358 08/03/2023   FERRITIN 87 08/03/2023   IRONPCTSAT 15 (L) 08/03/2023   Lab Results  Component Value Date   LDH 192 08/03/2023       Component Value Date/Time   TOTALPROTELP 6.5 08/03/2023 1035   ALBUMINELP 3.6 08/03/2023 1035   A1GS 0.2 08/03/2023 1035   A2GS 0.9 08/03/2023 1035   BETS 1.1 08/03/2023 1035   GAMS 0.7 08/03/2023 1035   MSPIKE Not Observed 08/03/2023 1035   LDH 192 08/03/2023 1036    Review Flowsheet       Latest Ref Rng & Units 08/03/2023  Oncology Labs  Ferritin 24 - 336 ng/mL 87   %SAT 17.9 - 39.5 % 15   Total Protein ELP 6.0 - 8.5 g/dL 6.5   Albumin ELP 2.9 - 4.4 g/dL 3.6   Alpha-1 Globulin 0.0 - 0.4 g/dL 0.2   Alpha-2 Globulin 0.4 - 1.0 g/dL 0.9   Beta Globulin 0.7 - 1.3 g/dL 1.1   Gamma Globulin 0.4 - 1.8 g/dL 0.7   M-Spike, % Not Observed g/dL Not Observed   LDH 98 - 192 U/L 192      STUDIES:   No results found.    ASSESSMENT & PLAN:   Assessment/Plan:  78 y.o. male with anemia of chronic disease.  As his hemoglobin remains above 11, I do not recommend any intervention at this time.  I asked him to contact his PCP regarding the chest x-ray results.  I will plan to see him back in 6 months for repeat clinical assessment.  The patient understands all the plans discussed today and is in agreement with them.  He knows to contact our  office if he develops concerns prior to his next appointment.     Andrez DELENA Foy, PA-C   Physician Assistant [facility] [phone]

## 2023-11-17 NOTE — Telephone Encounter (Signed)
 Detailed message left for patient and his wife.

## 2023-11-17 NOTE — Telephone Encounter (Signed)
 Patient has been scheduled for follow-up visit per 11/17/23 LOS.  Pt given an appt calendar with date and time.

## 2023-11-22 DIAGNOSIS — R339 Retention of urine, unspecified: Secondary | ICD-10-CM | POA: Diagnosis not present

## 2023-11-22 DIAGNOSIS — N401 Enlarged prostate with lower urinary tract symptoms: Secondary | ICD-10-CM | POA: Diagnosis not present

## 2023-11-22 DIAGNOSIS — J449 Chronic obstructive pulmonary disease, unspecified: Secondary | ICD-10-CM | POA: Diagnosis not present

## 2023-11-22 DIAGNOSIS — Z6824 Body mass index (BMI) 24.0-24.9, adult: Secondary | ICD-10-CM | POA: Diagnosis not present

## 2023-11-22 DIAGNOSIS — N390 Urinary tract infection, site not specified: Secondary | ICD-10-CM | POA: Diagnosis not present

## 2023-11-22 DIAGNOSIS — D649 Anemia, unspecified: Secondary | ICD-10-CM | POA: Diagnosis not present

## 2023-11-22 DIAGNOSIS — I509 Heart failure, unspecified: Secondary | ICD-10-CM | POA: Diagnosis not present

## 2023-11-24 ENCOUNTER — Telehealth: Payer: Self-pay

## 2023-11-24 NOTE — Telephone Encounter (Signed)
-----   Message from Andrez DELENA Foy sent at 11/21/2023  8:23 AM EDT ----- Please let his wife know his iron stores are lower, but otherwise stable. Continue oral iron daily. Keep appt in 6 months. Call if he feels more fatigued prior to that. Thanks

## 2023-11-24 NOTE — Telephone Encounter (Signed)
 Tried to reach out to patient's wife today, left message that I would leave a message on the patient's phone. Voicemail left on patient's phone to advise of iron results and to continue iron. Also advised to call the office if patient starts to have increased fatigue.

## 2023-11-30 DIAGNOSIS — I509 Heart failure, unspecified: Secondary | ICD-10-CM | POA: Diagnosis not present

## 2023-11-30 DIAGNOSIS — Z6825 Body mass index (BMI) 25.0-25.9, adult: Secondary | ICD-10-CM | POA: Diagnosis not present

## 2023-11-30 DIAGNOSIS — I498 Other specified cardiac arrhythmias: Secondary | ICD-10-CM | POA: Diagnosis not present

## 2023-11-30 DIAGNOSIS — N400 Enlarged prostate without lower urinary tract symptoms: Secondary | ICD-10-CM | POA: Diagnosis not present

## 2023-11-30 DIAGNOSIS — R0602 Shortness of breath: Secondary | ICD-10-CM | POA: Diagnosis not present

## 2023-11-30 DIAGNOSIS — E1140 Type 2 diabetes mellitus with diabetic neuropathy, unspecified: Secondary | ICD-10-CM | POA: Diagnosis not present

## 2023-11-30 DIAGNOSIS — E0821 Diabetes mellitus due to underlying condition with diabetic nephropathy: Secondary | ICD-10-CM | POA: Diagnosis not present

## 2023-11-30 DIAGNOSIS — F320 Major depressive disorder, single episode, mild: Secondary | ICD-10-CM | POA: Diagnosis not present

## 2023-11-30 DIAGNOSIS — I517 Cardiomegaly: Secondary | ICD-10-CM | POA: Diagnosis not present

## 2023-11-30 DIAGNOSIS — Z8673 Personal history of transient ischemic attack (TIA), and cerebral infarction without residual deficits: Secondary | ICD-10-CM | POA: Diagnosis not present

## 2023-11-30 DIAGNOSIS — I2510 Atherosclerotic heart disease of native coronary artery without angina pectoris: Secondary | ICD-10-CM | POA: Diagnosis not present

## 2023-11-30 DIAGNOSIS — J9811 Atelectasis: Secondary | ICD-10-CM | POA: Diagnosis not present

## 2023-11-30 DIAGNOSIS — E785 Hyperlipidemia, unspecified: Secondary | ICD-10-CM | POA: Diagnosis not present

## 2023-11-30 DIAGNOSIS — J90 Pleural effusion, not elsewhere classified: Secondary | ICD-10-CM | POA: Diagnosis not present

## 2023-11-30 DIAGNOSIS — E559 Vitamin D deficiency, unspecified: Secondary | ICD-10-CM | POA: Diagnosis not present

## 2023-11-30 DIAGNOSIS — I10 Essential (primary) hypertension: Secondary | ICD-10-CM | POA: Diagnosis not present

## 2023-11-30 DIAGNOSIS — Z79899 Other long term (current) drug therapy: Secondary | ICD-10-CM | POA: Diagnosis not present

## 2023-11-30 LAB — LAB REPORT - SCANNED
A1c: 6.8
EGFR (Non-African Amer.): 48

## 2023-12-05 DIAGNOSIS — R339 Retention of urine, unspecified: Secondary | ICD-10-CM | POA: Diagnosis not present

## 2023-12-11 DIAGNOSIS — I2510 Atherosclerotic heart disease of native coronary artery without angina pectoris: Secondary | ICD-10-CM | POA: Diagnosis not present

## 2023-12-11 DIAGNOSIS — I480 Paroxysmal atrial fibrillation: Secondary | ICD-10-CM | POA: Diagnosis not present

## 2023-12-11 DIAGNOSIS — I351 Nonrheumatic aortic (valve) insufficiency: Secondary | ICD-10-CM | POA: Diagnosis not present

## 2023-12-11 DIAGNOSIS — R0609 Other forms of dyspnea: Secondary | ICD-10-CM | POA: Diagnosis not present

## 2023-12-11 DIAGNOSIS — I110 Hypertensive heart disease with heart failure: Secondary | ICD-10-CM | POA: Diagnosis not present

## 2023-12-11 DIAGNOSIS — I5022 Chronic systolic (congestive) heart failure: Secondary | ICD-10-CM | POA: Diagnosis not present

## 2023-12-11 DIAGNOSIS — I358 Other nonrheumatic aortic valve disorders: Secondary | ICD-10-CM | POA: Diagnosis not present

## 2023-12-11 DIAGNOSIS — I440 Atrioventricular block, first degree: Secondary | ICD-10-CM | POA: Diagnosis not present

## 2023-12-11 DIAGNOSIS — Z7901 Long term (current) use of anticoagulants: Secondary | ICD-10-CM | POA: Diagnosis not present

## 2023-12-11 DIAGNOSIS — I255 Ischemic cardiomyopathy: Secondary | ICD-10-CM | POA: Diagnosis not present

## 2023-12-11 DIAGNOSIS — Z955 Presence of coronary angioplasty implant and graft: Secondary | ICD-10-CM | POA: Diagnosis not present

## 2023-12-11 DIAGNOSIS — I252 Old myocardial infarction: Secondary | ICD-10-CM | POA: Diagnosis not present

## 2023-12-11 DIAGNOSIS — E785 Hyperlipidemia, unspecified: Secondary | ICD-10-CM | POA: Diagnosis not present

## 2023-12-14 DIAGNOSIS — I509 Heart failure, unspecified: Secondary | ICD-10-CM | POA: Diagnosis not present

## 2023-12-14 DIAGNOSIS — I2510 Atherosclerotic heart disease of native coronary artery without angina pectoris: Secondary | ICD-10-CM | POA: Diagnosis not present

## 2023-12-14 DIAGNOSIS — R0602 Shortness of breath: Secondary | ICD-10-CM | POA: Diagnosis not present

## 2023-12-14 DIAGNOSIS — J449 Chronic obstructive pulmonary disease, unspecified: Secondary | ICD-10-CM | POA: Diagnosis not present

## 2023-12-14 DIAGNOSIS — Z6825 Body mass index (BMI) 25.0-25.9, adult: Secondary | ICD-10-CM | POA: Diagnosis not present

## 2023-12-19 ENCOUNTER — Encounter: Payer: Self-pay | Admitting: Internal Medicine

## 2023-12-21 DIAGNOSIS — N401 Enlarged prostate with lower urinary tract symptoms: Secondary | ICD-10-CM | POA: Diagnosis not present

## 2023-12-21 DIAGNOSIS — N390 Urinary tract infection, site not specified: Secondary | ICD-10-CM | POA: Diagnosis not present

## 2023-12-21 DIAGNOSIS — R339 Retention of urine, unspecified: Secondary | ICD-10-CM | POA: Diagnosis not present

## 2023-12-27 DIAGNOSIS — J449 Chronic obstructive pulmonary disease, unspecified: Secondary | ICD-10-CM | POA: Diagnosis not present

## 2024-01-01 DIAGNOSIS — I5020 Unspecified systolic (congestive) heart failure: Secondary | ICD-10-CM | POA: Diagnosis not present

## 2024-01-01 DIAGNOSIS — R0609 Other forms of dyspnea: Secondary | ICD-10-CM | POA: Diagnosis not present

## 2024-01-01 DIAGNOSIS — I2510 Atherosclerotic heart disease of native coronary artery without angina pectoris: Secondary | ICD-10-CM | POA: Diagnosis not present

## 2024-01-01 DIAGNOSIS — I083 Combined rheumatic disorders of mitral, aortic and tricuspid valves: Secondary | ICD-10-CM | POA: Diagnosis not present

## 2024-01-01 DIAGNOSIS — I272 Pulmonary hypertension, unspecified: Secondary | ICD-10-CM | POA: Diagnosis not present

## 2024-01-10 DIAGNOSIS — I10 Essential (primary) hypertension: Secondary | ICD-10-CM | POA: Diagnosis not present

## 2024-01-10 DIAGNOSIS — I255 Ischemic cardiomyopathy: Secondary | ICD-10-CM | POA: Diagnosis not present

## 2024-01-10 DIAGNOSIS — I2510 Atherosclerotic heart disease of native coronary artery without angina pectoris: Secondary | ICD-10-CM | POA: Diagnosis not present

## 2024-01-10 DIAGNOSIS — I110 Hypertensive heart disease with heart failure: Secondary | ICD-10-CM | POA: Diagnosis not present

## 2024-01-10 DIAGNOSIS — I252 Old myocardial infarction: Secondary | ICD-10-CM | POA: Diagnosis not present

## 2024-01-10 DIAGNOSIS — I351 Nonrheumatic aortic (valve) insufficiency: Secondary | ICD-10-CM | POA: Diagnosis not present

## 2024-01-10 DIAGNOSIS — R0609 Other forms of dyspnea: Secondary | ICD-10-CM | POA: Diagnosis not present

## 2024-01-10 DIAGNOSIS — Z955 Presence of coronary angioplasty implant and graft: Secondary | ICD-10-CM | POA: Diagnosis not present

## 2024-01-10 DIAGNOSIS — I358 Other nonrheumatic aortic valve disorders: Secondary | ICD-10-CM | POA: Diagnosis not present

## 2024-01-10 DIAGNOSIS — I5022 Chronic systolic (congestive) heart failure: Secondary | ICD-10-CM | POA: Diagnosis not present

## 2024-01-10 DIAGNOSIS — I420 Dilated cardiomyopathy: Secondary | ICD-10-CM | POA: Diagnosis not present

## 2024-01-10 DIAGNOSIS — R9439 Abnormal result of other cardiovascular function study: Secondary | ICD-10-CM | POA: Diagnosis not present

## 2024-01-10 DIAGNOSIS — E785 Hyperlipidemia, unspecified: Secondary | ICD-10-CM | POA: Diagnosis not present

## 2024-01-11 DIAGNOSIS — R530 Neoplastic (malignant) related fatigue: Secondary | ICD-10-CM | POA: Diagnosis not present

## 2024-01-11 DIAGNOSIS — Z6825 Body mass index (BMI) 25.0-25.9, adult: Secondary | ICD-10-CM | POA: Diagnosis not present

## 2024-01-11 DIAGNOSIS — J019 Acute sinusitis, unspecified: Secondary | ICD-10-CM | POA: Diagnosis not present

## 2024-01-16 DIAGNOSIS — E0821 Diabetes mellitus due to underlying condition with diabetic nephropathy: Secondary | ICD-10-CM | POA: Diagnosis not present

## 2024-01-16 DIAGNOSIS — F320 Major depressive disorder, single episode, mild: Secondary | ICD-10-CM | POA: Diagnosis not present

## 2024-01-16 DIAGNOSIS — E785 Hyperlipidemia, unspecified: Secondary | ICD-10-CM | POA: Diagnosis not present

## 2024-01-17 DIAGNOSIS — J90 Pleural effusion, not elsewhere classified: Secondary | ICD-10-CM | POA: Diagnosis not present

## 2024-01-17 DIAGNOSIS — I130 Hypertensive heart and chronic kidney disease with heart failure and stage 1 through stage 4 chronic kidney disease, or unspecified chronic kidney disease: Secondary | ICD-10-CM | POA: Diagnosis not present

## 2024-01-17 DIAGNOSIS — I2510 Atherosclerotic heart disease of native coronary artery without angina pectoris: Secondary | ICD-10-CM | POA: Diagnosis not present

## 2024-01-17 DIAGNOSIS — D509 Iron deficiency anemia, unspecified: Secondary | ICD-10-CM | POA: Diagnosis not present

## 2024-01-17 DIAGNOSIS — R310 Gross hematuria: Secondary | ICD-10-CM | POA: Diagnosis not present

## 2024-01-17 DIAGNOSIS — N189 Chronic kidney disease, unspecified: Secondary | ICD-10-CM | POA: Diagnosis not present

## 2024-01-17 DIAGNOSIS — R9439 Abnormal result of other cardiovascular function study: Secondary | ICD-10-CM | POA: Diagnosis not present

## 2024-01-17 DIAGNOSIS — I480 Paroxysmal atrial fibrillation: Secondary | ICD-10-CM | POA: Diagnosis not present

## 2024-01-17 DIAGNOSIS — I509 Heart failure, unspecified: Secondary | ICD-10-CM | POA: Diagnosis not present

## 2024-01-18 DIAGNOSIS — N281 Cyst of kidney, acquired: Secondary | ICD-10-CM | POA: Diagnosis not present

## 2024-01-18 DIAGNOSIS — Z885 Allergy status to narcotic agent status: Secondary | ICD-10-CM | POA: Diagnosis not present

## 2024-01-18 DIAGNOSIS — Z79899 Other long term (current) drug therapy: Secondary | ICD-10-CM | POA: Diagnosis not present

## 2024-01-18 DIAGNOSIS — I5023 Acute on chronic systolic (congestive) heart failure: Secondary | ICD-10-CM | POA: Diagnosis not present

## 2024-01-18 DIAGNOSIS — D509 Iron deficiency anemia, unspecified: Secondary | ICD-10-CM | POA: Diagnosis not present

## 2024-01-18 DIAGNOSIS — I480 Paroxysmal atrial fibrillation: Secondary | ICD-10-CM | POA: Diagnosis not present

## 2024-01-18 DIAGNOSIS — R252 Cramp and spasm: Secondary | ICD-10-CM | POA: Diagnosis not present

## 2024-01-18 DIAGNOSIS — I255 Ischemic cardiomyopathy: Secondary | ICD-10-CM | POA: Diagnosis not present

## 2024-01-18 DIAGNOSIS — E785 Hyperlipidemia, unspecified: Secondary | ICD-10-CM | POA: Diagnosis not present

## 2024-01-18 DIAGNOSIS — Z7984 Long term (current) use of oral hypoglycemic drugs: Secondary | ICD-10-CM | POA: Diagnosis not present

## 2024-01-18 DIAGNOSIS — I509 Heart failure, unspecified: Secondary | ICD-10-CM | POA: Diagnosis not present

## 2024-01-18 DIAGNOSIS — I420 Dilated cardiomyopathy: Secondary | ICD-10-CM | POA: Diagnosis not present

## 2024-01-18 DIAGNOSIS — N189 Chronic kidney disease, unspecified: Secondary | ICD-10-CM | POA: Diagnosis not present

## 2024-01-18 DIAGNOSIS — I130 Hypertensive heart and chronic kidney disease with heart failure and stage 1 through stage 4 chronic kidney disease, or unspecified chronic kidney disease: Secondary | ICD-10-CM | POA: Diagnosis not present

## 2024-01-18 DIAGNOSIS — J4489 Other specified chronic obstructive pulmonary disease: Secondary | ICD-10-CM | POA: Diagnosis not present

## 2024-01-18 DIAGNOSIS — I25110 Atherosclerotic heart disease of native coronary artery with unstable angina pectoris: Secondary | ICD-10-CM | POA: Diagnosis not present

## 2024-01-18 DIAGNOSIS — R9439 Abnormal result of other cardiovascular function study: Secondary | ICD-10-CM | POA: Diagnosis not present

## 2024-01-18 DIAGNOSIS — I351 Nonrheumatic aortic (valve) insufficiency: Secondary | ICD-10-CM | POA: Diagnosis not present

## 2024-01-18 DIAGNOSIS — I358 Other nonrheumatic aortic valve disorders: Secondary | ICD-10-CM | POA: Diagnosis not present

## 2024-01-18 DIAGNOSIS — I2510 Atherosclerotic heart disease of native coronary artery without angina pectoris: Secondary | ICD-10-CM | POA: Diagnosis not present

## 2024-01-18 DIAGNOSIS — I252 Old myocardial infarction: Secondary | ICD-10-CM | POA: Diagnosis not present

## 2024-01-18 DIAGNOSIS — Z87891 Personal history of nicotine dependence: Secondary | ICD-10-CM | POA: Diagnosis not present

## 2024-01-18 DIAGNOSIS — Z955 Presence of coronary angioplasty implant and graft: Secondary | ICD-10-CM | POA: Diagnosis not present

## 2024-01-18 DIAGNOSIS — R319 Hematuria, unspecified: Secondary | ICD-10-CM | POA: Diagnosis not present

## 2024-01-18 DIAGNOSIS — E1122 Type 2 diabetes mellitus with diabetic chronic kidney disease: Secondary | ICD-10-CM | POA: Diagnosis not present

## 2024-01-18 DIAGNOSIS — Z7901 Long term (current) use of anticoagulants: Secondary | ICD-10-CM | POA: Diagnosis not present

## 2024-01-18 DIAGNOSIS — Z902 Acquired absence of lung [part of]: Secondary | ICD-10-CM | POA: Diagnosis not present

## 2024-01-18 DIAGNOSIS — I272 Pulmonary hypertension, unspecified: Secondary | ICD-10-CM | POA: Diagnosis not present

## 2024-01-22 ENCOUNTER — Ambulatory Visit: Admitting: Podiatry

## 2024-01-22 DIAGNOSIS — I351 Nonrheumatic aortic (valve) insufficiency: Secondary | ICD-10-CM | POA: Diagnosis not present

## 2024-01-22 DIAGNOSIS — Z955 Presence of coronary angioplasty implant and graft: Secondary | ICD-10-CM | POA: Diagnosis not present

## 2024-01-22 DIAGNOSIS — I2510 Atherosclerotic heart disease of native coronary artery without angina pectoris: Secondary | ICD-10-CM | POA: Diagnosis not present

## 2024-01-22 DIAGNOSIS — I252 Old myocardial infarction: Secondary | ICD-10-CM | POA: Diagnosis not present

## 2024-01-22 DIAGNOSIS — I358 Other nonrheumatic aortic valve disorders: Secondary | ICD-10-CM | POA: Diagnosis not present

## 2024-01-22 DIAGNOSIS — I5020 Unspecified systolic (congestive) heart failure: Secondary | ICD-10-CM | POA: Diagnosis not present

## 2024-01-22 DIAGNOSIS — I255 Ischemic cardiomyopathy: Secondary | ICD-10-CM | POA: Diagnosis not present

## 2024-01-22 DIAGNOSIS — I5022 Chronic systolic (congestive) heart failure: Secondary | ICD-10-CM | POA: Diagnosis not present

## 2024-01-22 DIAGNOSIS — I420 Dilated cardiomyopathy: Secondary | ICD-10-CM | POA: Diagnosis not present

## 2024-01-26 DIAGNOSIS — R339 Retention of urine, unspecified: Secondary | ICD-10-CM | POA: Diagnosis not present

## 2024-01-26 DIAGNOSIS — J449 Chronic obstructive pulmonary disease, unspecified: Secondary | ICD-10-CM | POA: Diagnosis not present

## 2024-01-30 DIAGNOSIS — I5020 Unspecified systolic (congestive) heart failure: Secondary | ICD-10-CM | POA: Diagnosis not present

## 2024-02-05 ENCOUNTER — Ambulatory Visit (INDEPENDENT_AMBULATORY_CARE_PROVIDER_SITE_OTHER): Admitting: Podiatry

## 2024-02-05 ENCOUNTER — Encounter: Payer: Self-pay | Admitting: Podiatry

## 2024-02-05 DIAGNOSIS — Z7901 Long term (current) use of anticoagulants: Secondary | ICD-10-CM

## 2024-02-05 DIAGNOSIS — M79674 Pain in right toe(s): Secondary | ICD-10-CM

## 2024-02-05 DIAGNOSIS — M79675 Pain in left toe(s): Secondary | ICD-10-CM

## 2024-02-05 DIAGNOSIS — B351 Tinea unguium: Secondary | ICD-10-CM

## 2024-02-05 DIAGNOSIS — E119 Type 2 diabetes mellitus without complications: Secondary | ICD-10-CM | POA: Diagnosis not present

## 2024-02-05 NOTE — Progress Notes (Signed)
  Subjective:  Patient ID: Samuel Green, male    DOB: Feb 22, 1946,  MRN: [id number]  Chief Complaint  Patient presents with   [facility]    [facility] with out callous A1c 6.8 in Aug Eliquis, no more plavix.     78 y.o. male presents with the above complaint. History confirmed with patient. Patient presenting with pain related to dystrophic thickened elongated nails. Patient is unable to trim own nails related to nail dystrophy and/or mobility issues. Patient does have a history of T2DM.  Last A1c approximately 6.1.  Patient is on chronic Eliquis.  Objective:  Physical Exam: warm, good capillary refill, dry xerotic pedal skin nail exam onychomycosis of the toenails, onycholysis, and dystrophic nails DP pulses palpable, PT pulses palpable, and protective sensation intact Left Foot:  Pain with palpation of nails due to elongation and dystrophic growth.  Right Foot: Pain with palpation of nails due to elongation and dystrophic growth.   Assessment:   1. Pain due to onychomycosis of toenails of both feet   2. Diabetes mellitus without complication (HCC)   3. Current use of long term anticoagulation      Plan:  Patient was evaluated and treated and all questions answered.   #Onychomycosis with pain  -Nails palliatively debrided as below. -Educated on self-care -Anticoagulated on eliquis  Procedure: Nail Debridement Rationale: Pain Type of Debridement: manual, sharp debridement. Instrumentation: Nail nipper, rotary burr. Number of Nails: 10   Patient educated on diabetes. Discussed proper diabetic foot care and discussed risks and complications of disease. Educated patient in depth on reasons to return to the office immediately should he/she discover anything concerning or new on the feet. All questions answered. Discussed proper shoes as well.    Return in about 3 months (around 05/07/2024) for Diabetic Foot Care.         Ethan Saddler, DPM [facility] / [facility]

## 2024-02-12 DIAGNOSIS — N401 Enlarged prostate with lower urinary tract symptoms: Secondary | ICD-10-CM | POA: Diagnosis not present

## 2024-02-12 DIAGNOSIS — R339 Retention of urine, unspecified: Secondary | ICD-10-CM | POA: Diagnosis not present

## 2024-02-12 DIAGNOSIS — R3129 Other microscopic hematuria: Secondary | ICD-10-CM | POA: Diagnosis not present

## 2024-02-14 DIAGNOSIS — I498 Other specified cardiac arrhythmias: Secondary | ICD-10-CM | POA: Diagnosis not present

## 2024-02-14 DIAGNOSIS — E0821 Diabetes mellitus due to underlying condition with diabetic nephropathy: Secondary | ICD-10-CM | POA: Diagnosis not present

## 2024-02-14 DIAGNOSIS — N400 Enlarged prostate without lower urinary tract symptoms: Secondary | ICD-10-CM | POA: Diagnosis not present

## 2024-02-14 DIAGNOSIS — Z6824 Body mass index (BMI) 24.0-24.9, adult: Secondary | ICD-10-CM | POA: Diagnosis not present

## 2024-02-14 DIAGNOSIS — I509 Heart failure, unspecified: Secondary | ICD-10-CM | POA: Diagnosis not present

## 2024-02-14 DIAGNOSIS — F320 Major depressive disorder, single episode, mild: Secondary | ICD-10-CM | POA: Diagnosis not present

## 2024-02-14 DIAGNOSIS — Z79899 Other long term (current) drug therapy: Secondary | ICD-10-CM | POA: Diagnosis not present

## 2024-02-14 DIAGNOSIS — I2510 Atherosclerotic heart disease of native coronary artery without angina pectoris: Secondary | ICD-10-CM | POA: Diagnosis not present

## 2024-02-14 DIAGNOSIS — E785 Hyperlipidemia, unspecified: Secondary | ICD-10-CM | POA: Diagnosis not present

## 2024-02-14 DIAGNOSIS — B351 Tinea unguium: Secondary | ICD-10-CM | POA: Diagnosis not present

## 2024-02-14 DIAGNOSIS — I10 Essential (primary) hypertension: Secondary | ICD-10-CM | POA: Diagnosis not present

## 2024-02-14 DIAGNOSIS — E559 Vitamin D deficiency, unspecified: Secondary | ICD-10-CM | POA: Diagnosis not present

## 2024-02-16 DIAGNOSIS — E0821 Diabetes mellitus due to underlying condition with diabetic nephropathy: Secondary | ICD-10-CM | POA: Diagnosis not present

## 2024-02-19 DIAGNOSIS — I5022 Chronic systolic (congestive) heart failure: Secondary | ICD-10-CM | POA: Diagnosis not present

## 2024-02-19 DIAGNOSIS — I480 Paroxysmal atrial fibrillation: Secondary | ICD-10-CM | POA: Diagnosis not present

## 2024-02-19 DIAGNOSIS — I110 Hypertensive heart disease with heart failure: Secondary | ICD-10-CM | POA: Diagnosis not present

## 2024-02-19 DIAGNOSIS — R0609 Other forms of dyspnea: Secondary | ICD-10-CM | POA: Diagnosis not present

## 2024-02-19 DIAGNOSIS — I358 Other nonrheumatic aortic valve disorders: Secondary | ICD-10-CM | POA: Diagnosis not present

## 2024-02-19 DIAGNOSIS — Z955 Presence of coronary angioplasty implant and graft: Secondary | ICD-10-CM | POA: Diagnosis not present

## 2024-02-19 DIAGNOSIS — E785 Hyperlipidemia, unspecified: Secondary | ICD-10-CM | POA: Diagnosis not present

## 2024-02-19 DIAGNOSIS — I420 Dilated cardiomyopathy: Secondary | ICD-10-CM | POA: Diagnosis not present

## 2024-02-19 DIAGNOSIS — I2510 Atherosclerotic heart disease of native coronary artery without angina pectoris: Secondary | ICD-10-CM | POA: Diagnosis not present

## 2024-02-19 DIAGNOSIS — I255 Ischemic cardiomyopathy: Secondary | ICD-10-CM | POA: Diagnosis not present

## 2024-02-19 DIAGNOSIS — I252 Old myocardial infarction: Secondary | ICD-10-CM | POA: Diagnosis not present

## 2024-02-19 DIAGNOSIS — I351 Nonrheumatic aortic (valve) insufficiency: Secondary | ICD-10-CM | POA: Diagnosis not present

## 2024-02-19 DIAGNOSIS — I5020 Unspecified systolic (congestive) heart failure: Secondary | ICD-10-CM | POA: Diagnosis not present

## 2024-02-23 DIAGNOSIS — R3129 Other microscopic hematuria: Secondary | ICD-10-CM | POA: Diagnosis not present

## 2024-02-26 DIAGNOSIS — R3129 Other microscopic hematuria: Secondary | ICD-10-CM | POA: Diagnosis not present

## 2024-03-06 DIAGNOSIS — I255 Ischemic cardiomyopathy: Secondary | ICD-10-CM | POA: Diagnosis not present

## 2024-03-06 DIAGNOSIS — I420 Dilated cardiomyopathy: Secondary | ICD-10-CM | POA: Diagnosis not present

## 2024-03-06 DIAGNOSIS — I5022 Chronic systolic (congestive) heart failure: Secondary | ICD-10-CM | POA: Diagnosis not present

## 2024-03-06 DIAGNOSIS — R9439 Abnormal result of other cardiovascular function study: Secondary | ICD-10-CM | POA: Diagnosis not present

## 2024-03-06 DIAGNOSIS — E785 Hyperlipidemia, unspecified: Secondary | ICD-10-CM | POA: Diagnosis not present

## 2024-03-06 DIAGNOSIS — I252 Old myocardial infarction: Secondary | ICD-10-CM | POA: Diagnosis not present

## 2024-03-06 DIAGNOSIS — I2510 Atherosclerotic heart disease of native coronary artery without angina pectoris: Secondary | ICD-10-CM | POA: Diagnosis not present

## 2024-03-06 DIAGNOSIS — Z955 Presence of coronary angioplasty implant and graft: Secondary | ICD-10-CM | POA: Diagnosis not present

## 2024-03-06 DIAGNOSIS — I110 Hypertensive heart disease with heart failure: Secondary | ICD-10-CM | POA: Diagnosis not present

## 2024-03-11 DIAGNOSIS — R0609 Other forms of dyspnea: Secondary | ICD-10-CM | POA: Diagnosis not present

## 2024-03-11 DIAGNOSIS — I110 Hypertensive heart disease with heart failure: Secondary | ICD-10-CM | POA: Diagnosis not present

## 2024-03-11 DIAGNOSIS — E785 Hyperlipidemia, unspecified: Secondary | ICD-10-CM | POA: Diagnosis not present

## 2024-03-11 DIAGNOSIS — I252 Old myocardial infarction: Secondary | ICD-10-CM | POA: Diagnosis not present

## 2024-03-11 DIAGNOSIS — I351 Nonrheumatic aortic (valve) insufficiency: Secondary | ICD-10-CM | POA: Diagnosis not present

## 2024-03-11 DIAGNOSIS — I2510 Atherosclerotic heart disease of native coronary artery without angina pectoris: Secondary | ICD-10-CM | POA: Diagnosis not present

## 2024-03-11 DIAGNOSIS — I5020 Unspecified systolic (congestive) heart failure: Secondary | ICD-10-CM | POA: Diagnosis not present

## 2024-03-11 DIAGNOSIS — R9439 Abnormal result of other cardiovascular function study: Secondary | ICD-10-CM | POA: Diagnosis not present

## 2024-03-11 DIAGNOSIS — Z955 Presence of coronary angioplasty implant and graft: Secondary | ICD-10-CM | POA: Diagnosis not present

## 2024-03-11 DIAGNOSIS — I255 Ischemic cardiomyopathy: Secondary | ICD-10-CM | POA: Diagnosis not present

## 2024-03-11 DIAGNOSIS — I358 Other nonrheumatic aortic valve disorders: Secondary | ICD-10-CM | POA: Diagnosis not present

## 2024-03-11 DIAGNOSIS — I5022 Chronic systolic (congestive) heart failure: Secondary | ICD-10-CM | POA: Diagnosis not present

## 2024-03-13 DIAGNOSIS — N401 Enlarged prostate with lower urinary tract symptoms: Secondary | ICD-10-CM | POA: Diagnosis not present

## 2024-03-13 DIAGNOSIS — R339 Retention of urine, unspecified: Secondary | ICD-10-CM | POA: Diagnosis not present

## 2024-03-13 DIAGNOSIS — R3129 Other microscopic hematuria: Secondary | ICD-10-CM | POA: Diagnosis not present

## 2024-05-06 ENCOUNTER — Ambulatory Visit (INDEPENDENT_AMBULATORY_CARE_PROVIDER_SITE_OTHER): Admitting: Podiatry

## 2024-05-06 DIAGNOSIS — M79674 Pain in right toe(s): Secondary | ICD-10-CM | POA: Diagnosis not present

## 2024-05-06 DIAGNOSIS — B351 Tinea unguium: Secondary | ICD-10-CM

## 2024-05-06 DIAGNOSIS — Z7901 Long term (current) use of anticoagulants: Secondary | ICD-10-CM

## 2024-05-06 DIAGNOSIS — M79675 Pain in left toe(s): Secondary | ICD-10-CM

## 2024-05-06 NOTE — Progress Notes (Signed)
"   °  °  Subjective:  Patient ID: Samuel Green, male    DOB: 12-Nov-1945,  MRN: [id number]  Samuel Green presents to clinic today for:  Chief Complaint  Patient presents with   [facility]    [facility] nails only.  A1c was 6.7 a while back Elliquis   Patient notes nails are thick, discolored, elongated and painful in shoegear when trying to ambulate.  Patient is diabetic and is on Eliquis.  Patient has calluses but states they do not bother him and did not request shaving of the lesions.  PCP is Gable Cambric, MD. last seen around 02/07/2024  Past Medical History:  Diagnosis Date   A-fib [facility])    Arrhythmia    Asthma    Atrial fibrillation (HCC)    COPD (chronic obstructive pulmonary disease) (HCC)    Diabetes (HCC)    Diverticulosis    Hx of colonic polyp    last colonoscopy recently   Hypertension    Kidney disease    MI (myocardial infarction) (HCC)    Mixed hyperlipidemia    PSA elevation    hx negative prostate bx   TIA (transient ischemic attack)    Past Surgical History:  Procedure Laterality Date   heart stent     LUNG LOBECTOMY Left    nn cancerous   TONSILLECTOMY AND ADENOIDECTOMY     Allergies[1]  Review of Systems: Negative except as noted in the HPI.  Objective:  Anguel Delapena is a pleasant 79 y.o. male in NAD. AAO x 3.  Vascular Examination: Capillary refill time is 3-5 seconds to toes bilateral. Palpable pedal pulses b/l LE. Digital hair present b/l.  Skin temperature gradient WNL b/l. No varicosities b/l. No cyanosis noted b/l.   Dermatological Examination: Pedal skin with normal turgor, texture and tone b/l. No open wounds. No interdigital macerations b/l. Toenails x10 are 3mm thick, discolored, dystrophic with subungual debris. There is pain with compression of the nail plates.  They are elongated x10  Assessment/Plan: 1. Pain due to onychomycosis of toenails of both feet   2. Current use of long term anticoagulation    The mycotic  toenails were sharply debrided x10 with sterile nail nippers and a power debriding burr to decrease bulk/thickness and length.    Return in about 3 months (around 08/04/2024) for [facility].   Awanda CHARM Imperial, DPM, FACFAS [facility]     [address] [place], [place] [address]                Office [phone]  Fax [phone]    [1]  Allergies Allergen Reactions   Morphine     Other reaction(s): Other (See Comments) Angry, violent   "

## 2024-05-20 ENCOUNTER — Inpatient Hospital Stay

## 2024-05-20 ENCOUNTER — Inpatient Hospital Stay: Admitting: Hematology and Oncology

## 2024-05-28 ENCOUNTER — Inpatient Hospital Stay

## 2024-05-28 ENCOUNTER — Inpatient Hospital Stay: Admitting: Hematology and Oncology

## 2024-08-05 ENCOUNTER — Ambulatory Visit: Admitting: Podiatry
